# Patient Record
Sex: Male | Born: 1985 | Race: Asian | Hispanic: No | Smoking: Current every day smoker
Health system: Southern US, Community
[De-identification: ages and names within clinical notes are randomized; demographics above are authoritative.]

## PROBLEM LIST (undated history)

## (undated) DIAGNOSIS — F419 Anxiety disorder, unspecified: Secondary | ICD-10-CM

## (undated) DIAGNOSIS — F32A Depression, unspecified: Secondary | ICD-10-CM

## (undated) DIAGNOSIS — J039 Acute tonsillitis, unspecified: Secondary | ICD-10-CM

## (undated) DIAGNOSIS — H9209 Otalgia, unspecified ear: Secondary | ICD-10-CM

## (undated) DIAGNOSIS — R0600 Dyspnea, unspecified: Secondary | ICD-10-CM

## (undated) DIAGNOSIS — K7689 Other specified diseases of liver: Secondary | ICD-10-CM

## (undated) DIAGNOSIS — R39198 Other difficulties with micturition: Secondary | ICD-10-CM

## (undated) DIAGNOSIS — Z8719 Personal history of other diseases of the digestive system: Secondary | ICD-10-CM

## (undated) DIAGNOSIS — K219 Gastro-esophageal reflux disease without esophagitis: Secondary | ICD-10-CM

## (undated) HISTORY — PX: UPPER GI ENDOSCOPY: SHX6162

## (undated) HISTORY — DX: Other specified diseases of liver: K76.89

## (undated) HISTORY — DX: Acute tonsillitis, unspecified: J03.90

## (undated) HISTORY — DX: Other difficulties with micturition: R39.198

## (undated) HISTORY — DX: Personal history of other diseases of the digestive system: Z87.19

## (undated) HISTORY — PX: NO PAST SURGERIES: SHX2092

## (undated) HISTORY — PX: WISDOM TOOTH EXTRACTION: SHX21

---

## 2012-11-13 ENCOUNTER — Encounter: Payer: Self-pay | Admitting: Family Medicine

## 2012-11-13 ENCOUNTER — Ambulatory Visit (INDEPENDENT_AMBULATORY_CARE_PROVIDER_SITE_OTHER): Payer: BC Managed Care – PPO | Admitting: Family Medicine

## 2012-11-13 VITALS — BP 102/64 | HR 70 | Temp 99.0°F | Ht 64.5 in | Wt 136.2 lb

## 2012-11-13 DIAGNOSIS — R109 Unspecified abdominal pain: Secondary | ICD-10-CM

## 2012-11-13 LAB — BASIC METABOLIC PANEL
BUN: 9 mg/dL (ref 6–23)
Chloride: 102 mEq/L (ref 96–112)
GFR: 90.95 mL/min (ref >60.00)
Potassium: 3.8 mEq/L (ref 3.5–5.1)

## 2012-11-13 LAB — HEPATIC FUNCTION PANEL
ALT: 38 U/L (ref 0–53)
AST: 25 U/L (ref 0–37)
Alkaline Phosphatase: 53 U/L (ref 39–117)
Total Bilirubin: 0.8 mg/dL (ref 0.3–1.2)

## 2012-11-13 LAB — CBC WITH DIFFERENTIAL/PLATELET
Basophils Relative: 0.6 % (ref 0.0–3.0)
Eosinophils Relative: 4.4 % (ref 0.0–5.0)
HCT: 46.8 % (ref 39.0–52.0)
Lymphs Abs: 1.6 10*3/uL (ref 0.7–4.0)
MCV: 88.7 fl (ref 78.0–100.0)
Monocytes Absolute: 0.4 10*3/uL (ref 0.1–1.0)
Monocytes Relative: 6.2 % (ref 3.0–12.0)
Neutrophils Relative %: 63.8 % (ref 43.0–77.0)
Platelets: 202 10*3/uL (ref 150.0–400.0)
RBC: 5.28 Mil/uL (ref 4.22–5.81)
WBC: 6.4 10*3/uL (ref 4.5–10.5)

## 2012-11-13 LAB — H. PYLORI ANTIBODY, IGG: H Pylori IgG: NEGATIVE

## 2012-11-13 MED ORDER — GI COCKTAIL ~~LOC~~
30.0000 mL | Freq: Once | ORAL | Status: AC
  Administered 2012-11-13: 30 mL via ORAL

## 2012-11-13 NOTE — Progress Notes (Signed)
    Subjective:     Tyler [name] is a 27 y.o. male who presents for evaluation of abdominal pain. Onset was more than 2 weeks ago. Symptoms have been gradually worsening. The pain is described as pressure-like and bloated.. Pain is located in the epigastric region without radiation.  Aggravating factors: spicy foods.  Alleviating factors: belching, H2 blockers and liquids. Associated symptoms: none. The patient denies anorexia, arthralagias, belching, chills, constipation, diarrhea, dysuria, fever, flatus, frequency, headache, hematochezia, hematuria, melena, myalgias, nausea, sweats and vomiting.  Past Medical History  Diagnosis Date  . No disease found    There are no active problems to display for this patient.  Past Surgical History  Procedure Laterality Date  . No past surgeries     Family History  Problem Relation Age of Onset  . Hypertension Mother    History   Social History  . Marital Status: Single    Spouse Name: N/A    Number of Children: N/A  . Years of Education: N/A   Social History Main Topics  . Smoking status: Current Every Day Smoker    Types: Cigarettes  . Smokeless tobacco: Never Used  . Alcohol Use: Yes  . Drug Use: No  . Sexual Activity: None   Other Topics Concern  . None   Social History Narrative  . None   No current outpatient prescriptions on file.   No current facility-administered medications for this visit.   Allergies: Review of patient's allergies indicates no known allergies.  Review of Systems Pertinent items are noted in HPI.     Objective:    BP 102/64  Pulse 70  Temp(Src) 99 F (37.2 C) (Oral)  Ht 5' 4.5" (1.638 m)  Wt 136 lb 3.2 oz (61.78 kg)  BMI 23.03 kg/m2  SpO2 96% General appearance: alert, cooperative, appears stated age and no distress Throat: lips, mucosa, and tongue normal; teeth and gums normal Neck: no adenopathy, supple, symmetrical, trachea midline and thyroid not enlarged, symmetric, no  tenderness/mass/nodules Lungs: clear to auscultation bilaterally Heart: S1, S2 normal Abdomen: + epigastric tenderness, soft , no guarding or rebound    Assessment:    Abdominal pain, likely secondary to gerd  R/o h pylori .    Plan:    See orders for lab and imaging studies. Adhere to simple, bland diet. Initiate empiric trial of acid suppression; see orders. Further follow-up plans will be based on outcome of lab/imaging studies; see orders. Arrange flexible sigmoidoscopy; see orders. Follow up as needed.

## 2012-11-13 NOTE — Patient Instructions (Signed)

## 2012-11-16 DIAGNOSIS — R109 Unspecified abdominal pain: Secondary | ICD-10-CM

## 2012-11-23 ENCOUNTER — Other Ambulatory Visit (INDEPENDENT_AMBULATORY_CARE_PROVIDER_SITE_OTHER): Payer: BC Managed Care – PPO

## 2012-11-23 ENCOUNTER — Telehealth: Payer: Self-pay | Admitting: Family Medicine

## 2012-11-23 DIAGNOSIS — R109 Unspecified abdominal pain: Secondary | ICD-10-CM

## 2012-11-23 LAB — HEPATIC FUNCTION PANEL
ALT: 53 U/L (ref 0–53)
Albumin: 4.6 g/dL (ref 3.5–5.2)
Alkaline Phosphatase: 55 U/L (ref 39–117)
Bilirubin, Direct: 0.1 mg/dL (ref 0.0–0.3)
Total Protein: 7.6 g/dL (ref 6.0–8.3)

## 2012-11-23 NOTE — Telephone Encounter (Signed)
11/23/2012  Pt came in today for labs.  He wanted to let you know he is now feeling better.  He did have stomach pains 2 days ago and was wondering what he can take when he has the stomach pains.  Please advise.  bw

## 2012-11-23 NOTE — Telephone Encounter (Signed)
FYI for MD.      KP 

## 2012-11-24 NOTE — Telephone Encounter (Signed)
Omeprazole 20 mg #30 1 po qd---  5 refills If pain comes back-- refer to GI

## 2012-11-26 ENCOUNTER — Ambulatory Visit (INDEPENDENT_AMBULATORY_CARE_PROVIDER_SITE_OTHER): Payer: BC Managed Care – PPO | Admitting: Family Medicine

## 2012-11-26 ENCOUNTER — Encounter: Payer: Self-pay | Admitting: Family Medicine

## 2012-11-26 VITALS — BP 98/62 | HR 72 | Temp 98.7°F | Wt 135.4 lb

## 2012-11-26 DIAGNOSIS — R748 Abnormal levels of other serum enzymes: Secondary | ICD-10-CM | POA: Insufficient documentation

## 2012-11-26 MED ORDER — OMEPRAZOLE 20 MG PO CPDR: 20.0000 mg | DELAYED_RELEASE_CAPSULE | Freq: Every day | ORAL | Status: DC

## 2012-11-26 NOTE — Assessment & Plan Note (Signed)
Check [place] abd con't prilosec If con't to inc--- refer to GI

## 2012-11-26 NOTE — Progress Notes (Signed)
  Subjective:    Patient ID: Tyler [name], male    DOB: 12/23/1985, 27 y.o.   MRN: [id number]  HPI Pt here to discuss labs.  abd pain is gone but amylase increased.  No new complaints.    Review of Systems As above    Objective:   Physical Exam  BP 98/62  Pulse 72  Temp(Src) 98.7 F (37.1 C) (Oral)  Wt 135 lb 6.4 oz (61.417 kg)  BMI 22.89 kg/m2  SpO2 97% General appearance: alert, cooperative, appears stated age and no distress Abdomen: soft, non-tender; bowel sounds normal; no masses,  no organomegaly       Assessment & Plan:

## 2012-11-26 NOTE — Patient Instructions (Addendum)
Amylase Amylase is an enzyme. It is measured in the blood. This test can provide information on the function of your pancreas. This test is used to diagnose swelling of the pancreas (pancreatitis) and other pancreatic diseases. The rise of amylase at the beginning of a pancreatitis attack, and its fall after about 2 days, helps to make a diagnosis. This test may be done if you have problems that suggest a disorder of the pancreas. Such problems may include severe abdominal pain, fever, loss of appetite, or nausea. Long-standing pancreatitis may be associated with alcoholism. It can also be caused by injury or a blocked pancreatic duct. It may also be associated with genetic abnormalities such as cystic fibrosis. Amylase levels may be only moderately increased with chronic pancreatitis. Amylase levels may be only moderately decreased when the cells that produce amylase in the pancreas are injured or destroyed. Other causes of an increase in this enzyme include problems with the bowel, ovaries, skeletal muscle, or mumps. Amylase is also used in the diagnosis and follow-up of cancer of the pancreas, ovaries, or lungs, gallbladder attack, and mumps.  PREPARATION FOR TEST No preparation or fasting is necessary. A blood sample will be drawn from a vein in the arm.  NORMAL FINDINGS   Newborn: 6 to 65 units/L  Adults: 60 to 180 units/L (0.8 to 3.2 microkatal/L) Amylase levels remain low for the first 2 months of life. They increase to adult values by 27 year of age. Values may be slightly increased during normal pregnancy and in older adults. Ranges for normal findings may vary among different laboratories and hospitals. You should always check with your caregiver after having lab work or other tests done to discuss the meaning of your test results and whether your values are considered within normal limits. MEANING OF TEST  Your caregiver will go over the test results with you. Your caregiver will discuss the  importance and meaning of your results. He or she will also discuss treatment options and additional tests, if needed. OBTAINING THE TEST RESULTS  It is your responsibility to obtain your test results. Ask the lab or department performing the test when and how you will get your results. Document Released: 03/08/2004 Document Revised: 05/09/2011 Document Reviewed: 01/21/2008 [facility] Patient Information 2014 [place], [place].

## 2012-11-26 NOTE — Telephone Encounter (Signed)
Patient aware and he requested to come in and discuss--Apt scheduled for today.      KP

## 2012-11-27 ENCOUNTER — Other Ambulatory Visit: Payer: Self-pay

## 2012-11-27 DIAGNOSIS — R748 Abnormal levels of other serum enzymes: Secondary | ICD-10-CM

## 2012-11-27 LAB — HEPATIC FUNCTION PANEL
ALT: 41 U/L (ref 0–53)
AST: 26 U/L (ref 0–37)
Albumin: 4.4 g/dL (ref 3.5–5.2)

## 2012-11-27 LAB — AMYLASE: Amylase: 247 U/L — ABNORMAL HIGH (ref 27–131)

## 2012-11-30 ENCOUNTER — Ambulatory Visit
Admission: RE | Admit: 2012-11-30 | Discharge: 2012-11-30 | Disposition: A | Discharge status: Home / Self Care | Payer: BC Managed Care – PPO | Source: Ambulatory Visit | Attending: Family Medicine | Admitting: Family Medicine

## 2012-11-30 DIAGNOSIS — R748 Abnormal levels of other serum enzymes: Secondary | ICD-10-CM

## 2012-12-07 ENCOUNTER — Encounter: Payer: Self-pay | Admitting: Internal Medicine

## 2012-12-11 ENCOUNTER — Encounter: Payer: Self-pay | Admitting: Internal Medicine

## 2012-12-11 ENCOUNTER — Ambulatory Visit (INDEPENDENT_AMBULATORY_CARE_PROVIDER_SITE_OTHER): Payer: BC Managed Care – PPO | Admitting: Internal Medicine

## 2012-12-11 ENCOUNTER — Other Ambulatory Visit (INDEPENDENT_AMBULATORY_CARE_PROVIDER_SITE_OTHER): Payer: BC Managed Care – PPO

## 2012-12-11 VITALS — BP 98/60 | HR 60 | Ht 65.0 in | Wt 134.5 lb

## 2012-12-11 DIAGNOSIS — K7689 Other specified diseases of liver: Secondary | ICD-10-CM

## 2012-12-11 DIAGNOSIS — R748 Abnormal levels of other serum enzymes: Secondary | ICD-10-CM

## 2012-12-11 DIAGNOSIS — R1011 Right upper quadrant pain: Secondary | ICD-10-CM

## 2012-12-11 DIAGNOSIS — K769 Liver disease, unspecified: Secondary | ICD-10-CM

## 2012-12-11 LAB — LIPASE: Lipase: 31 U/L (ref 11.0–59.0)

## 2012-12-11 MED ORDER — OMEPRAZOLE 20 MG PO CPDR: 40.0000 mg | DELAYED_RELEASE_CAPSULE | Freq: Every day | ORAL | Status: DC

## 2012-12-11 NOTE — Assessment & Plan Note (Addendum)
?   Cause NL lipase suggests not from pancreas check isoenzymes, he could have macroamylasemia or a salivary source

## 2012-12-11 NOTE — Assessment & Plan Note (Addendum)
Most of his abdominal pain sounds nondescript, and doesn't lead to an obvious diagnosis. He did have an episode or 2 that sound like biliary colic though he has not had any stones seen on ultrasound. Await the lab testing. I suggested he try a probiotic. He could need other cross-sectional imaging depending upon the results of the repeat lipase and amylase isoenzymes. I have asked him to increase his omeprazole 40 mg every morning to see if that makes a difference is a 20 mg did not seem to help.

## 2012-12-11 NOTE — Patient Instructions (Addendum)
Your physician has requested that you go to the basement for lab work before leaving today.  We will contact you with your results.  We are changing your omeprazole to 2 tablets daily.  Try align probiotic to put the good bacteria back in your colon.  Coupon provided.  It is the time of year to have a vaccination to prevent the flu (influenza virus).  Please have this done through your primary care provider or you can get this done at local pharmacies or the [facility]. It would be very helpful if you notify your primary care provider when and where you had the vaccination given by messaging them in My Chart, leaving a message or faxing the information.   I appreciate the opportunity to care for you.

## 2012-12-11 NOTE — Assessment & Plan Note (Signed)
This is most likely a hemangioma. I've explained this in reassured as best I can. Following up in 6 months with ultrasound is likely to make the most sense. If he needs an MRI for some reason otherwise then we might do that instead but at this point I can't see doing that.

## 2012-12-11 NOTE — Progress Notes (Signed)
Referred by: Lelon Perla, DO  Subjective:    Patient ID: Tyler [name], male    DOB: [birth year], 27 y.o.   MRN: [id number]  HPI The patient is a very pleasant young single [place] man with a several month history of abdominal discomfort. He feels bloated and like he needs to belch but is unable to. He's had partial relief and Gas-X. He saw Dr. Sol Blazing and his amylase was elevated so he had an abdominal ultrasound which was unrevealing though there was a 7 mm maximum hyperechoic lesion in the right lobe of the liver. LFTs within normal. He quit alcohol, he has been on omeprazole 20 mg daily but has not really any better. In general the pain is mild and more of a bother. He mostly has bloating as mentioned. However one time he had severe right upper quadrant pain described as burning and cramping that occurred 5 minutes on at 5 minutes off the last for several hours. He was unable to go to bed and then had more in the morning. He has not had more problems like that. He has not had fever or chills or vomiting or nausea. He is tried a bland diet and reduce smoking as well and still the amylase was elevated. He has not had unintentional weight loss. GI review of systems is otherwise negative.   Review of Systems     Objective:   Physical Exam General:  Well-developed, well-nourished and in no acute distress Eyes:  anicteric. ENT:   Mouth and posterior pharynx free of lesions.  Neck:   supple w/o thyromegaly or mass.  Lungs: Clear to auscultation bilaterally. Heart:  S1S2, no rubs, murmurs, gallops. Abdomen:  soft, non-tender except mild RUQ tenderness, no hepatosplenomegaly, hernia, or mass and BS+.  Lymph:  no cervical or supraclavicular adenopathy. Extremities:   no edema Skin   no rash. Neuro:  A&O x 3.  Psych:  appropriate mood and  Affect.   Data Reviewed:        Assessment & Plan:

## 2012-12-13 LAB — AMYLASE ISOENZYMES
Amylase.: 126 U/L — ABNORMAL HIGH (ref 21–101)
Isoamylase-Pancreatic: 41 U/L (ref 16–46)
Salivary Fraction: 85 U/L — ABNORMAL HIGH (ref 4–61)

## 2012-12-14 NOTE — Progress Notes (Signed)
Quick Note:  Elevated amylase is from salivary gland and likely not a problem  Ask if he has any salivary gland sxs (pain in that area, dry mouth)  Ask if higher dose omeprazole helping  Set up REV 6 weeks also to f/u dyspepsia   ______

## 2013-07-09 ENCOUNTER — Other Ambulatory Visit: Payer: Self-pay | Admitting: Family Medicine

## 2013-07-09 ENCOUNTER — Telehealth: Payer: Self-pay | Admitting: *Deleted

## 2013-07-09 NOTE — Telephone Encounter (Signed)
Patient has been made aware and voiced understanding.     KP 

## 2013-07-09 NOTE — Telephone Encounter (Signed)
She is already in that practice--- she can call and ask for second opinion

## 2013-07-09 NOTE — Telephone Encounter (Signed)
Please advise      KP 

## 2013-07-09 NOTE — Telephone Encounter (Signed)
Caller name:  [place] Relation to pt:  self Call back number: [phone] Pharmacy:  Reason for call:   Pt called requesting referral for Dr. Docia Chuck. Henrene Pastor at [facility].  States he had a referral to see someone else, and they said nothing was wrong.  Pt wants 2nd opinion.  Please advise.  bw

## 2013-07-10 ENCOUNTER — Telehealth: Payer: Self-pay | Admitting: Internal Medicine

## 2013-07-10 ENCOUNTER — Encounter: Payer: Self-pay | Admitting: *Deleted

## 2013-07-10 NOTE — Telephone Encounter (Signed)
Patient will come in and see Alonza Bogus, PA on Friday at 1:30

## 2013-07-10 NOTE — Telephone Encounter (Signed)
Left message for patient to call back  

## 2013-07-12 ENCOUNTER — Encounter: Payer: Self-pay | Admitting: Internal Medicine

## 2013-07-12 ENCOUNTER — Ambulatory Visit (INDEPENDENT_AMBULATORY_CARE_PROVIDER_SITE_OTHER): Payer: BC Managed Care – PPO | Admitting: Gastroenterology

## 2013-07-12 ENCOUNTER — Encounter: Payer: Self-pay | Admitting: Gastroenterology

## 2013-07-12 VITALS — BP 108/64 | HR 70 | Ht 65.0 in | Wt 138.0 lb

## 2013-07-12 DIAGNOSIS — R1013 Epigastric pain: Secondary | ICD-10-CM | POA: Insufficient documentation

## 2013-07-12 DIAGNOSIS — R140 Abdominal distension (gaseous): Secondary | ICD-10-CM | POA: Insufficient documentation

## 2013-07-12 DIAGNOSIS — R142 Eructation: Secondary | ICD-10-CM

## 2013-07-12 DIAGNOSIS — R141 Gas pain: Secondary | ICD-10-CM

## 2013-07-12 DIAGNOSIS — K219 Gastro-esophageal reflux disease without esophagitis: Secondary | ICD-10-CM | POA: Insufficient documentation

## 2013-07-12 DIAGNOSIS — R143 Flatulence: Secondary | ICD-10-CM

## 2013-07-12 NOTE — Progress Notes (Addendum)
07/12/2013 Tyler [name] [id number] October 26, 1985   History of Present Illness:  This is a 28 year old male who was previously seen by Dr. Carlean Purl in October 2014 for complaints of abdominal pain that was nondescript and not leading to any obvious diagnosis. Ultrasound of the abdomen showed a very likely benign lesion in the right hepatic lobe. His pancreatic amylase and lipase levels were normal as well as a CBC, CMP, and he also had a negative H. pylori IgG. He was placed on omeprazole 40 mg daily. Patient reports that the medication seemed to help his symptoms for a while, but eventually he discontinued it. He comes in today with several random complaints.  First, he notes a lot of noises and bubbling in his abdomen. He had one day where he had pain in his belly button that only lasted 1 or 2 minutes and has note recurred. He also complains of feeling full when waking up in the morning despite having regular bowel movements and no bowel issues. He states that he does have pain in his epigastrium as well as some reflux, but once again is not currently on a PPI. He had one occasion where he drank too much alcohol and he had an episode of vomiting the next day that contained some blood; this occurred just on one occasion. He has seen any blood in his stool or black stools.   Current Medications, Allergies, Past Medical History, Past Surgical History, Family History and Social History were reviewed in [employer] record.   Physical Exam: BP 108/64  Pulse 70  Ht 5\' 5"  (1.651 m)  Wt 138 lb (62.596 kg)  BMI 22.96 kg/m2 General: Well developed [place] male in no acute distress Head: Normocephalic and atraumatic Eyes:  Sclerae anicteric, conjunctiva pink  Ears: Normal auditory acuity Lungs: Clear throughout to auscultation Heart: Regular rate and rhythm Abdomen: Soft, non-distended.  Normal bowel sounds.  Mild epigastric TTP without R/R/G. Musculoskeletal: Symmetrical with no  gross deformities  Extremities: No edema  Neurological: Alert oriented x 4, grossly non-focal Psychological:  Alert and cooperative. Normal mood and affect  Assessment and Recommendations: -Epigastric abdominal pain and GERD with one episode of hematemesis after large alcohol consumption:  Will schedule EGD for evaluation to rule out esophagitis, ulcer disease, etc. -Bloating:  Patient likely has a component of irritable bowel syndrome with ongoing complaints of hyperactivity noted in his abdomen. No bowel issues. Will check celiac labs.

## 2013-07-12 NOTE — Progress Notes (Signed)
Agree - note hyperactivity in abdomen not "dad"

## 2013-07-12 NOTE — Patient Instructions (Addendum)
You have a pre visit appointment on 07-29-2013 at 9 am to sign paper work for Upper Endoscopy.   Your physician has requested that you go to the basement for the following lab work before leaving today: Celiac

## 2013-07-29 ENCOUNTER — Ambulatory Visit (AMBULATORY_SURGERY_CENTER): Payer: Self-pay

## 2013-07-29 VITALS — Ht 65.0 in | Wt 137.8 lb

## 2013-07-29 DIAGNOSIS — R1013 Epigastric pain: Secondary | ICD-10-CM

## 2013-07-29 NOTE — Progress Notes (Signed)
No allergies to eggs or soy No past anesthesia No diet/weight loss meds No home oxygen Has email  Emmi instructions given for EGD

## 2013-08-06 ENCOUNTER — Encounter: Payer: Self-pay | Admitting: Internal Medicine

## 2013-08-06 ENCOUNTER — Ambulatory Visit (AMBULATORY_SURGERY_CENTER): Payer: BC Managed Care – PPO | Admitting: Internal Medicine

## 2013-08-06 ENCOUNTER — Other Ambulatory Visit: Payer: Self-pay

## 2013-08-06 VITALS — BP 105/62 | HR 49 | Temp 96.7°F | Resp 15 | Ht 65.0 in | Wt 137.0 lb

## 2013-08-06 DIAGNOSIS — R1013 Epigastric pain: Secondary | ICD-10-CM

## 2013-08-06 DIAGNOSIS — K769 Liver disease, unspecified: Secondary | ICD-10-CM

## 2013-08-06 DIAGNOSIS — D1330 Benign neoplasm of unspecified part of small intestine: Secondary | ICD-10-CM

## 2013-08-06 MED ORDER — SODIUM CHLORIDE 0.9 % IV SOLN: 500.0000 mL | INTRAVENOUS | Status: DC

## 2013-08-06 NOTE — Patient Instructions (Addendum)
The esophagus, stomach and duodenum look normal. I did take biopsies of the intestine to check for celiac sprue.  I will let you know those results and plans.  You need an ultrasound of the liver to follow-up on the small abnormality there. My nurse will arrange that with you.  I appreciate the opportunity to care for you. Gatha Mayer, MD, [facility]   Discharge instructions given with verbal understanding. Biopsies taken. Resume previous medications. YOU HAD AN ENDOSCOPIC PROCEDURE TODAY AT [facility]: Refer to the procedure report that was given to you for any specific questions about what was found during the examination.  If the procedure report does not answer your questions, please call your gastroenterologist to clarify.  If you requested that your care partner not be given the details of your procedure findings, then the procedure report has been included in a sealed envelope for you to review at your convenience later.  YOU SHOULD EXPECT: Some feelings of bloating in the abdomen. Passage of more gas than usual.  Walking can help get rid of the air that was put into your GI tract during the procedure and reduce the bloating. If you had a lower endoscopy (such as a colonoscopy or flexible sigmoidoscopy) you may notice spotting of blood in your stool or on the toilet paper. If you underwent a bowel prep for your procedure, then you may not have a normal bowel movement for a few days.  DIET: Your first meal following the procedure should be a light meal and then it is ok to progress to your normal diet.  A half-sandwich or bowl of soup is an example of a good first meal.  Heavy or fried foods are harder to digest and may make you feel nauseous or bloated.  Likewise meals heavy in dairy and vegetables can cause extra gas to form and this can also increase the bloating.  Drink plenty of fluids but you should avoid alcoholic beverages for 24 hours.  ACTIVITY: Your care partner  should take you home directly after the procedure.  You should plan to take it easy, moving slowly for the rest of the day.  You can resume normal activity the day after the procedure however you should NOT DRIVE or use heavy machinery for 24 hours (because of the sedation medicines used during the test).    SYMPTOMS TO REPORT IMMEDIATELY: A gastroenterologist can be reached at any hour.  During normal business hours, 8:30 AM to 5:00 PM Monday through Friday, call [phone].  After hours and on weekends, please call the GI answering service at [phone] who will take a message and have the physician on call contact you.   Following upper endoscopy (EGD)  Vomiting of blood or coffee ground material  New chest pain or pain under the shoulder blades  Painful or persistently difficult swallowing  New shortness of breath  Fever of 100F or higher  Black, tarry-looking stools  FOLLOW UP: If any biopsies were taken you will be contacted by phone or by letter within the next 1-3 weeks.  Call your gastroenterologist if you have not heard about the biopsies in 3 weeks.  Our staff will call the home number listed on your records the next business day following your procedure to check on you and address any questions or concerns that you may have at that time regarding the information given to you following your procedure. This is a courtesy call and so if there is  no answer at the home number and we have not heard from you through the emergency physician on call, we will assume that you have returned to your regular daily activities without incident.  SIGNATURES/CONFIDENTIALITY: You and/or your care partner have signed paperwork which will be entered into your electronic medical record.  These signatures attest to the fact that that the information above on your After Visit Summary has been reviewed and is understood.  Full responsibility of the confidentiality of this discharge information lies  with you and/or your care-partner.

## 2013-08-06 NOTE — Progress Notes (Signed)
Called to room to assist during endoscopic procedure.  Patient ID and intended procedure confirmed with present staff. Received instructions for my participation in the procedure from the performing physician.  

## 2013-08-06 NOTE — Progress Notes (Signed)
Pt stable to RR 

## 2013-08-06 NOTE — Op Note (Signed)
[name] [place], [address]   ENDOSCOPY PROCEDURE REPORT  PATIENT: Tyler, [name]  MR#: [id number] BIRTHDATE: 11/30/1985 , 27  yrs. old GENDER: Male ENDOSCOPIST: Gatha Mayer, MD, [facility] PROCEDURE DATE:  08/06/2013 PROCEDURE:  EGD w/ biopsy ASA CLASS:     Class I INDICATIONS:  Epigastric pain. MEDICATIONS: Propofol (Diprivan) 140 mg IV, MAC sedation, administered by CRNA, and These medications were titrated to patient response per physician's verbal order TOPICAL ANESTHETIC: none  DESCRIPTION OF PROCEDURE: After the risks benefits and alternatives of the procedure were thoroughly explained, informed consent was obtained.  The LB [id number] endoscope was introduced through the mouth and advanced to the second portion of the duodenum. Without limitations.  The instrument was slowly withdrawn as the mucosa was fully examined.      The upper, middle and distal third of the esophagus were carefully inspected and no abnormalities were noted.  The z-line was well seen at the GEJ.  The endoscope was pushed into the fundus which was normal including a retroflexed view.  The antrum, gastric body, first and second part of the duodenum were unremarkable.  Multiple biopsies were performed from bulb and D2.  Retroflexed views revealed no abnormalities.     The scope was then withdrawn from the patient and the procedure completed.  COMPLICATIONS: There were no complications. ENDOSCOPIC IMPRESSION: Normal EGD; multiple biopsies of duodenum to look for occult celiac sprue  RECOMMENDATIONS: 1.  Await pathology results 2.  Abdominal Ultrasound - Limited RUQ follow-up small hyperechoic liver lesion right lobe on 11/2012 [place] - my office will schedule   eSigned:  Gatha Mayer, MD, [facility] 08/06/2013 3:50 PM   CC:The Patient

## 2013-08-07 ENCOUNTER — Telehealth: Payer: Self-pay

## 2013-08-07 NOTE — Telephone Encounter (Signed)
Left a message at [phone] for the pt to call back if any questions or concerns. maw

## 2013-08-13 NOTE — Progress Notes (Signed)
Quick Note:  He does not have celiac disease Let him know and we will call again with ultrasound results  Graham no letter or recall ______

## 2013-08-14 ENCOUNTER — Telehealth: Payer: Self-pay | Admitting: Family Medicine

## 2013-08-14 NOTE — Telephone Encounter (Signed)
Patient Information:  Caller Name: Reilley  Phone: [phone]  Patient: Tyler [name], Tyler [name]  Gender: Male  DOB: February 04, 1986  Age: 28 Years  PCP: Rosalita Chessman.  Office Follow Up:  Does the office need to follow up with this patient?: No  Instructions For The Office: N/A  RN Note:  Currently at work.  Symptoms  Reason For Call & Symptoms: Has had chest discomfort at times over a few weeks, feeling like bones in chest need to pop like bones in fingers so has stretched shoulders back and when he can hear noise like knuckles cracking, seems to feel better.   Woke 3:30 am today with pain in upper chest below clavicle and down into center chest for 2-3 bone levels.  Pain was so severe he could not move.   Pain has been constant since then, feels like heavy pressure, currently 5/10 level.  Pain worse if lying down, lessens if sitting.  Reviewed Health History In EMR: Yes  Reviewed Medications In EMR: Yes  Reviewed Allergies In EMR: Yes  Reviewed Surgeries / Procedures: Yes  Date of Onset of Symptoms: 08/14/2013  Treatments Tried: Has been spraying on numbing solution onto chest  Treatments Tried Worked: No  Guideline(s) Used:  Chest Pain  Disposition Per Guideline:   Call EMS 911 Now  Reason For Disposition Reached:   Chest pain lasting longer than 5 minutes and ANY of the following:  Over 13 years old Over 45 years old and at least one cardiac risk factor (i.e., high blood pressure, diabetes, high cholesterol, obesity, smoker or strong family history of heart disease) Pain is crushing, pressure-like, or heavy  Took nitroglycerin and chest pain was not relieved History of heart disease (i.e., angina, heart attack, bypass surgery, angioplasty, CHF)  Advice Given:  N/A  Patient Will Follow Care Advice:  YES

## 2013-08-15 ENCOUNTER — Ambulatory Visit (HOSPITAL_COMMUNITY)
Admission: RE | Admit: 2013-08-15 | Discharge: 2013-08-15 | Disposition: A | Discharge status: Home / Self Care | Payer: BC Managed Care – PPO | Source: Ambulatory Visit | Attending: Internal Medicine | Admitting: Internal Medicine

## 2013-08-15 ENCOUNTER — Encounter: Payer: Self-pay | Admitting: Internal Medicine

## 2013-08-15 DIAGNOSIS — K769 Liver disease, unspecified: Secondary | ICD-10-CM

## 2013-08-15 DIAGNOSIS — K7689 Other specified diseases of liver: Secondary | ICD-10-CM | POA: Insufficient documentation

## 2013-08-15 NOTE — Progress Notes (Signed)
Quick Note:  Let him know that US shows stability of the small lesion so not a cancer and most likely a common thing called a hemangioma which is a collection of blood vessels - not a problem for hi,m  I advise align or other probiotic daily to help his bloating and borborygmi (bubbling sounds) and see me prn  Am ccing Jess for F/U ______

## 2014-09-19 ENCOUNTER — Ambulatory Visit (HOSPITAL_BASED_OUTPATIENT_CLINIC_OR_DEPARTMENT_OTHER)
Admission: RE | Admit: 2014-09-19 | Discharge: 2014-09-19 | Disposition: A | Discharge status: Home / Self Care | Payer: BLUE CROSS/BLUE SHIELD | Source: Ambulatory Visit | Attending: Family Medicine | Admitting: Family Medicine

## 2014-09-19 ENCOUNTER — Encounter: Payer: Self-pay | Admitting: Family Medicine

## 2014-09-19 ENCOUNTER — Ambulatory Visit (INDEPENDENT_AMBULATORY_CARE_PROVIDER_SITE_OTHER): Payer: BLUE CROSS/BLUE SHIELD | Admitting: Family Medicine

## 2014-09-19 VITALS — BP 100/60 | HR 78 | Temp 99.1°F | Wt 140.8 lb

## 2014-09-19 DIAGNOSIS — R05 Cough: Secondary | ICD-10-CM | POA: Diagnosis not present

## 2014-09-19 DIAGNOSIS — J209 Acute bronchitis, unspecified: Secondary | ICD-10-CM | POA: Diagnosis not present

## 2014-09-19 DIAGNOSIS — R072 Precordial pain: Secondary | ICD-10-CM | POA: Insufficient documentation

## 2014-09-19 DIAGNOSIS — R059 Cough, unspecified: Secondary | ICD-10-CM

## 2014-09-19 LAB — CBC WITH DIFFERENTIAL/PLATELET
BASOS PCT: 1 % (ref 0–1)
Basophils Absolute: 0.1 10*3/uL (ref 0.0–0.1)
Eosinophils Absolute: 0.2 10*3/uL (ref 0.0–0.7)
Eosinophils Relative: 2 % (ref 0–5)
HCT: 43.7 % (ref 39.0–52.0)
HEMOGLOBIN: 15.4 g/dL (ref 13.0–17.0)
Lymphocytes Relative: 29 % (ref 12–46)
Lymphs Abs: 2.3 10*3/uL (ref 0.7–4.0)
MCH: 30.5 pg (ref 26.0–34.0)
MCHC: 35.2 g/dL (ref 30.0–36.0)
MCV: 86.5 fL (ref 78.0–100.0)
MPV: 9.4 fL (ref 8.6–12.4)
Monocytes Absolute: 0.6 10*3/uL (ref 0.1–1.0)
Monocytes Relative: 8 % (ref 3–12)
Neutro Abs: 4.9 10*3/uL (ref 1.7–7.7)
Neutrophils Relative %: 60 % (ref 43–77)
Platelets: 222 10*3/uL (ref 150–400)
RBC: 5.05 MIL/uL (ref 4.22–5.81)
RDW: 12.7 % (ref 11.5–15.5)
WBC: 8.1 10*3/uL (ref 4.0–10.5)

## 2014-09-19 MED ORDER — AMOXICILLIN-POT CLAVULANATE 875-125 MG PO TABS
1.0000 | ORAL_TABLET | Freq: Two times a day (BID) | ORAL | Status: DC
Start: 2014-09-19 — End: 2015-02-11

## 2014-09-19 NOTE — Progress Notes (Signed)
Patient ID: Tyler [name], male    DOB: 1985/10/23  Age: 29 y.o. MRN: [id number]    Subjective:  Subjective HPI Tyler [name] presents with c/o uri symptoms about 2 weeks ago.  It has progressed to productive cough and wheezing.  Pt thinks he had a fever as well.  He tried dayquil and nyquil --- he feels like his chest is tight and wants to pop.  He is now just taking tylenol.    Review of Systems  Constitutional: Positive for fever. Negative for chills.  HENT: Negative for congestion, postnasal drip, rhinorrhea and sinus pressure.   Respiratory: Positive for cough, chest tightness and wheezing. Negative for shortness of breath.   Cardiovascular: Negative for chest pain, palpitations and leg swelling.  Allergic/Immunologic: Negative for environmental allergies.    History Past Medical History  Diagnosis Date  . Other specified disorders of liver   . Tonsillitis     He has past surgical history that includes No past surgeries.   His family history includes Hypertension in his mother; Kidney disease in his paternal uncle.He reports that he has been smoking Cigarettes.  He has a 5 pack-year smoking history. He has never used smokeless tobacco. He reports that he does not drink alcohol or use illicit drugs.  Current Outpatient Prescriptions on File Prior to Visit  Medication Sig Dispense Refill  . acetaminophen (TYLENOL) 500 MG tablet Take 1,000 mg by mouth every 6 (six) hours as needed.    Marland Kitchen ibuprofen (ADVIL,MOTRIN) 200 MG tablet Take 200 mg by mouth every 6 (six) hours as needed.    . sodium-potassium bicarbonate (ALKA-SELTZER GOLD) TBEF dissolvable tablet Take 1 tablet by mouth daily as needed.     No current facility-administered medications on file prior to visit.     Objective:  Objective Physical Exam  Constitutional: He is oriented to person, place, and time. He appears well-developed and well-nourished.  HENT:  Right Ear: External ear normal.  Left Ear: External ear  normal.  + PND + errythema  Eyes: Conjunctivae are normal. Right eye exhibits no discharge. Left eye exhibits no discharge.  Cardiovascular: Normal rate, regular rhythm and normal heart sounds.   No murmur heard. Pulmonary/Chest: Effort normal and breath sounds normal. No respiratory distress. He has no wheezes. He has no rales. He exhibits no tenderness.  Musculoskeletal: He exhibits no edema.  Lymphadenopathy:    He has no cervical adenopathy.  Neurological: He is alert and oriented to person, place, and time.  Psychiatric: His mood appears anxious. He does not exhibit a depressed mood. He expresses no homicidal ideation.   BP 100/60 mmHg  Pulse 78  Temp(Src) 99.1 F (37.3 C) (Oral)  Wt 140 lb 12.8 oz (63.866 kg)  SpO2 98% Wt Readings from Last 3 Encounters:  09/19/14 140 lb 12.8 oz (63.866 kg)  08/06/13 137 lb (62.143 kg)  07/29/13 137 lb 12.8 oz (62.506 kg)     Lab Results  Component Value Date   WBC 6.4 11/13/2012   HGB 15.9 11/13/2012   HCT 46.8 11/13/2012   PLT 202.0 11/13/2012   GLUCOSE 115* 11/13/2012   ALT 41 11/26/2012   AST 26 11/26/2012   NA 137 11/13/2012   K 3.8 11/13/2012   CL 102 11/13/2012   CREATININE 1.0 11/13/2012   BUN 9 11/13/2012   CO2 29 11/13/2012    US Abdomen Limited Ruq  08/15/2013   CLINICAL DATA:  Follow up hyperechoic liver lesion right lobe on [place] from 11/2012  EXAM:  US ABDOMEN LIMITED - RIGHT UPPER QUADRANT  COMPARISON:  None.  FINDINGS: Gallbladder:  No gallstones or wall thickening visualized, measuring 2 mm. No sonographic Murphy sign noted.  Common bile duct:  Diameter: 2 mm.  Liver:  Stable small hyperechoic nodule right lobe of the liver. The sonographic appearance and stability favor a benign small hemangioma. Liver otherwise normal limits in parenchymal echogenicity.  IMPRESSION: Stable small echogenic nodule right lobe of the liver. Stability and sonographic characteristics favor a small benign hemangioma.  No further sonographic  abnormalities.   Electronically Signed   By: Margaree Mackintosh M.D.   On: 08/15/2013 10:38     Assessment & Plan:  Plan I am having Mr. Sebo start on amoxicillin-clavulanate. I am also having him maintain his acetaminophen, sodium-potassium bicarbonate, and ibuprofen.  Meds ordered this encounter  Medications  . amoxicillin-clavulanate (AUGMENTIN) 875-125 MG per tablet    Sig: Take 1 tablet by mouth 2 (two) times daily.    Dispense:  20 tablet    Refill:  0    Problem List Items Addressed This Visit    None    Visit Diagnoses    Cough    -  Primary    Relevant Orders    DG Chest 2 View (Completed)    CBC with Differential/Platelet    Acute bronchitis, unspecified organism        Relevant Medications    amoxicillin-clavulanate (AUGMENTIN) 875-125 MG per tablet    Other Relevant Orders    CBC with Differential/Platelet       Follow-up: Return if symptoms worsen or fail to improve.  Garnet Koyanagi, DO

## 2014-09-19 NOTE — Progress Notes (Signed)
Pre visit review using our clinic review tool, if applicable. No additional management support is needed unless otherwise documented below in the visit note. 

## 2014-09-19 NOTE — Patient Instructions (Signed)
Cough, Adult  A cough is a reflex that helps clear your throat and airways. It can help heal the body or may be a reaction to an irritated airway. A cough may only last 2 or 3 weeks (acute) or may last more than 8 weeks (chronic).  CAUSES Acute cough:  Viral or bacterial infections. Chronic cough:  Infections.  Allergies.  Asthma.  Post-nasal drip.  Smoking.  Heartburn or acid reflux.  Some medicines.  Chronic lung problems (COPD).  Cancer. SYMPTOMS   Cough.  Fever.  Chest pain.  Increased breathing rate.  High-pitched whistling sound when breathing (wheezing).  Colored mucus that you cough up (sputum). TREATMENT   A bacterial cough may be treated with antibiotic medicine.  A viral cough must run its course and will not respond to antibiotics.  Your caregiver may recommend other treatments if you have a chronic cough. HOME CARE INSTRUCTIONS   Only take over-the-counter or prescription medicines for pain, discomfort, or fever as directed by your caregiver. Use cough suppressants only as directed by your caregiver.  Use a cold steam vaporizer or humidifier in your bedroom or home to help loosen secretions.  Sleep in a semi-upright position if your cough is worse at night.  Rest as needed.  Stop smoking if you smoke. SEEK IMMEDIATE MEDICAL CARE IF:   You have pus in your sputum.  Your cough starts to worsen.  You cannot control your cough with suppressants and are losing sleep.  You begin coughing up blood.  You have difficulty breathing.  You develop pain which is getting worse or is uncontrolled with medicine.  You have a fever. MAKE SURE YOU:   Understand these instructions.  Will watch your condition.  Will get help right away if you are not doing well or get worse. Document Released: 08/13/2010 Document Revised: 05/09/2011 Document Reviewed: 08/13/2010 [employer] Patient Information 2015 [employer]. This information is not intended  to replace advice given to you by your health care provider. Make sure you discuss any questions you have with your health care provider.  

## 2014-09-20 ENCOUNTER — Encounter: Payer: Self-pay | Admitting: Family Medicine

## 2014-09-22 ENCOUNTER — Telehealth: Payer: Self-pay | Admitting: Family Medicine

## 2014-09-22 NOTE — Telephone Encounter (Signed)
Pt returned call. Notified pt of normal labs and negative chest xray. Pt will call back if still having trouble after finishing abx.

## 2014-09-23 ENCOUNTER — Telehealth: Payer: Self-pay | Admitting: Family Medicine

## 2014-09-23 NOTE — Telephone Encounter (Signed)
Relation to pt: self Call back number: [phone]   Reason for call:  Patient states amoxicillin-clavulanate (AUGMENTIN) 875-125 MG per tablet is causing patient to be constipated. Please advise

## 2014-09-23 NOTE — Telephone Encounter (Signed)
Spoke with patient regarding the below note. He reported trouble with having a bowel movement for the past three days, but no rectal pain or bleeding. However, patient does verbalizes some occasional abdominal pain, but addresses it as very minor. Please advise.

## 2014-09-23 NOTE — Telephone Encounter (Signed)
Spoke with patient and he verbalized understanding and has agreed to try the Miralax and increase his fiber. He will follow up if no improvement.     KP

## 2014-09-23 NOTE — Telephone Encounter (Signed)
augmentin normally causes diarrhea not constipation Take miralax daily,  Increase fiber in diet and f/u if symptoms do not improve

## 2014-09-30 ENCOUNTER — Other Ambulatory Visit: Payer: Self-pay | Admitting: Family Medicine

## 2014-09-30 DIAGNOSIS — R071 Chest pain on breathing: Secondary | ICD-10-CM

## 2014-09-30 NOTE — Telephone Encounter (Signed)
Patient was seen on 09/19/14 and was complaining of pain near sternum, at that time he had a cough- CXR negative, given Augmentin.  Patient states that he has completed the Augmentin and the cough has resolved but the pain is the same.  No shortness of breath.  He states it is worst when he first wakes up in the morning and the only thing that relieves is if he is able to "pop" it by stretching, which does not always work.  He scheduled an office follow-up visit 10/02/14- is there anything he should do in the meantime?   Please advise.

## 2014-09-30 NOTE — Telephone Encounter (Signed)
Please Triage symptoms.    KP

## 2014-09-30 NOTE — Telephone Encounter (Signed)
Ct chest ordered 

## 2014-09-30 NOTE — Telephone Encounter (Signed)
Caller name: Zeki Relationship to patient: self Can be reached: [phone]  Reason for call: Pt is having pain in rib cage still (related to below). He states he was advised to call when RX was completed. Pt asked for return call.

## 2014-10-02 ENCOUNTER — Ambulatory Visit (INDEPENDENT_AMBULATORY_CARE_PROVIDER_SITE_OTHER): Payer: BLUE CROSS/BLUE SHIELD | Admitting: Family Medicine

## 2014-10-02 ENCOUNTER — Encounter: Payer: Self-pay | Admitting: Family Medicine

## 2014-10-02 ENCOUNTER — Ambulatory Visit (HOSPITAL_BASED_OUTPATIENT_CLINIC_OR_DEPARTMENT_OTHER)
Admission: RE | Admit: 2014-10-02 | Discharge: 2014-10-02 | Disposition: A | Discharge status: Home / Self Care | Payer: BLUE CROSS/BLUE SHIELD | Source: Ambulatory Visit | Attending: Family Medicine | Admitting: Family Medicine

## 2014-10-02 VITALS — BP 96/68 | HR 72 | Temp 98.3°F | Resp 18 | Ht 65.0 in | Wt 140.0 lb

## 2014-10-02 DIAGNOSIS — R0789 Other chest pain: Secondary | ICD-10-CM

## 2014-10-02 DIAGNOSIS — R079 Chest pain, unspecified: Secondary | ICD-10-CM

## 2014-10-02 NOTE — Progress Notes (Signed)
Pre visit review using our clinic review tool, if applicable. No additional management support is needed unless otherwise documented below in the visit note. 

## 2014-10-02 NOTE — Progress Notes (Signed)
Patient ID: Tyler [name], male    DOB: 02/21/1986  Age: 29 y.o. MRN: [id number]    Subjective:  Subjective HPI Tyler [name] presents for f/u pain in sternum--- popping with stretching.  No new inury  Review of Systems  Constitutional: Negative for diaphoresis, appetite change, fatigue and unexpected weight change.  Eyes: Negative for pain, redness and visual disturbance.  Respiratory: Negative for cough, chest tightness, shortness of breath and wheezing.   Cardiovascular: Negative for chest pain, palpitations and leg swelling.  Endocrine: Negative for cold intolerance, heat intolerance, polydipsia, polyphagia and polyuria.  Genitourinary: Negative for dysuria, frequency and difficulty urinating.  Neurological: Negative for dizziness, light-headedness, numbness and headaches.    History Past Medical History  Diagnosis Date  . Other specified disorders of liver   . Tonsillitis     He has past surgical history that includes No past surgeries.   His family history includes Hypertension in his mother; Kidney disease in his paternal uncle.He reports that he has been smoking Cigarettes.  He has a 5 pack-year smoking history. He has never used smokeless tobacco. He reports that he does not drink alcohol or use illicit drugs.  Current Outpatient Prescriptions on File Prior to Visit  Medication Sig Dispense Refill  . acetaminophen (TYLENOL) 500 MG tablet Take 1,000 mg by mouth every 6 (six) hours as needed.    Marland Kitchen amoxicillin-clavulanate (AUGMENTIN) 875-125 MG per tablet Take 1 tablet by mouth 2 (two) times daily. (Patient not taking: Reported on 10/02/2014) 20 tablet 0  . ibuprofen (ADVIL,MOTRIN) 200 MG tablet Take 200 mg by mouth every 6 (six) hours as needed.    . sodium-potassium bicarbonate (ALKA-SELTZER GOLD) TBEF dissolvable tablet Take 1 tablet by mouth daily as needed.     No current facility-administered medications on file prior to visit.     Objective:  Objective Physical  Exam  Constitutional: He is oriented to person, place, and time. Vital signs are normal. He appears well-developed and well-nourished. He is sleeping.  HENT:  Head: Normocephalic and atraumatic.  Mouth/Throat: Oropharynx is clear and moist.  Eyes: EOM are normal. Pupils are equal, round, and reactive to light.  Neck: Normal range of motion. Neck supple. No thyromegaly present.  Cardiovascular: Normal rate and regular rhythm.   No murmur heard. Pulmonary/Chest: Effort normal and breath sounds normal. No respiratory distress. He has no wheezes. He has no rales. He exhibits no tenderness.  Musculoskeletal: He exhibits no edema or tenderness.  Neurological: He is alert and oriented to person, place, and time.  Skin: Skin is warm and dry.  Psychiatric: He has a normal mood and affect. His behavior is normal. Judgment and thought content normal.   BP 96/68 mmHg  Pulse 72  Temp(Src) 98.3 F (36.8 C) (Oral)  Resp 18  Ht 5\' 5"  (1.651 m)  Wt 140 lb (63.504 kg)  BMI 23.30 kg/m2  SpO2 99% Wt Readings from Last 3 Encounters:  10/02/14 140 lb (63.504 kg)  09/19/14 140 lb 12.8 oz (63.866 kg)  08/06/13 137 lb (62.143 kg)     Lab Results  Component Value Date   WBC 8.1 09/19/2014   HGB 15.4 09/19/2014   HCT 43.7 09/19/2014   PLT 222 09/19/2014   GLUCOSE 115* 11/13/2012   ALT 41 11/26/2012   AST 26 11/26/2012   NA 137 11/13/2012   K 3.8 11/13/2012   CL 102 11/13/2012   CREATININE 1.0 11/13/2012   BUN 9 11/13/2012   CO2 29 11/13/2012  No results found.   Assessment & Plan:  Plan I am having Tyler [name] maintain his acetaminophen, sodium-potassium bicarbonate, ibuprofen, and amoxicillin-clavulanate.  No orders of the defined types were placed in this encounter.    Problem List Items Addressed This Visit    None    Visit Diagnoses    Sternal pain    -  Primary    Relevant Orders    DG Sternum (Completed)     ct pending Pt extemely anxious He goes to chiropractor for  his back... He feels better when he stretches and "pops" chest.    Pt will see chiropractor and see if ribs need adjusted  Follow-up: Return in about 6 months (around 04/04/2015), or if symptoms worsen or fail to improve, for annual exam, fasting.  Garnet Koyanagi, DO

## 2014-10-02 NOTE — Patient Instructions (Addendum)
Dentist--- Dr Christie Nottingham Take zyrtec , claritin , or allegra---and get nasacort or rhinocort over the counter     Sternoclavicular Separation A sternoclavicular separation is an uncommon injury. It occurs when the ligaments between the breastbone (sternum) and collarbone (clavicle) are stretched or torn (sprained). A sprain of these ligaments can vary in severity. Sprains are classified into 3 categories. Grade 1 sprains cause pain, but the tendon is not lengthened. Grade 2 sprains include a lengthened ligament, due to the ligament being stretched or partially ruptured. With grade 2 sprains, there is still function, although function may be decreased. Grade 3 sprains are marked by a complete tear of the ligament. The joint has a loss of function. SYMPTOMS   Severe pain, tenderness, swelling, bruising, and sometimes a bony bump at the sternoclavicular joint.  Pain at the sternoclavicular joint when trying to bring the affected arm across and in front of the body.  Respiratory symptoms. (This is rare, but if any occur, it is an emergency situation.)  Hoarse voice.  Difficulty swallowing.  Neck fullness.  Choking sensation. CAUSES   This condition is caused by injury (trauma) to the ligament that connects the sternoclavicular joint. If the injury places a greater force on the ligament than it can withstand, a sprain occurs.  Common injuries causing a sternoclavicular separation are:  Collarbone injury.  Violent force from the side.  Compression of the shoulder toward the sternum.  Falling on an outstretched hand (less common). RISK INCREASES WITH:  Contact sports (football, soccer, boxing) and weightlifting.  Previous collarbone injury or sternal injury.  Poor strength and flexibility.  Inadequate or poorly fitted protective equipment. PREVENTION  Warm up and stretch properly before activity.  Maintain proper conditioning:  Shoulder and arm flexibility.  Muscle  strength and endurance.  Wear properly fitted and padded protective equipment (chest and shoulder pads).  Learn and use proper technique (including falling and landing). Have a coach correct improper technique.  Taping, protective strapping, or an adhesive bandage may be advised before practice or competition. PROGNOSIS  If treated properly, sternoclavicular separations can typically be cured. Healing time varies, depending on the severity of the injury and the type of sport played. Occasionally, surgery is needed to repair the torn ligaments. RELATED COMPLICATIONS   Weakness and fatigue of the arm and shoulder (uncommon).  Continued pain and inflammation of the sternoclavicular joint.  Longer healing time and being vulnerable to recurrent injury, if usual activities are resumed too soon.  Prolonged pain or disability (occasionally).  Unstable or arthritic shoulder, following repeated injury.  Death from posterior displacement of collarbone into airway, arteries, veins, or nerves of the neck. TREATMENT Treatment first involves ice and medicine to reduce pain and inflammation. The sternoclavicular joint is typically immobilized with an arm sling for a period of time. This allows for healing. Strengthening and stretching exercises may be needed to prevent shoulder stiffness after immobilization. These exercises may be performed at home or with a therapist. Return to sport depends upon:  The severity of injury.  The type of sport played. Surgery may be needed, especially if the sprain is posteriorly displaced (collarbone goes backward into the neck). This may cause compression of the vital structures in the neck, including the:  Airway.  Voice box.  Blood vessels to the arms or head. Posteriorly displaced sprains are a medical emergency and must be treated immediately. Rarely, surgery is needed for those with chronic pain who have not recovered after 4 to 6 months of  non-surgical  treatment.  MEDICATION   If pain medicine is needed, nonsteroidal anti-inflammatory medicines, such as aspirin and ibuprofen, or other minor pain relievers, such as acetaminophen, are often recommended.  Do not take pain medicine for 7 days before surgery.  Prescription pain relievers may be prescribed. Use only as directed and only as much as you need.  Ointments applied to the skin may be helpful.  Injections of corticosteroids may be given to reduce inflammation. However, this medicine is not usually used for acute injuries. HEAT AND COLD  Cold treatment (icing) relieves pain and reduces inflammation. Cold treatment should be applied for 10 to 15 minutes every 2 to 3 hours for inflammation and pain and immediately after any activity that aggravates your symptoms. Use ice packs or massage the area with a piece of ice (ice massage).  Heat treatment may be used before performing the stretching and strengthening activities prescribed by your caregiver, physical therapist, or athletic trainer. Use a heat pack or soak your injury in warm water. SEEK MEDICAL CARE IF:   Pain, swelling, or bruising gets worse, despite treatment.  You experience pain, numbness, swelling, or coldness in the arm.  Blue, gray, or dark color appears in the fingernails.  You develop a hoarse voice or difficulty swallowing.  The collarbone moves back out of normal position (if it was repositioned).  Any of the following occur after surgery:  Increased pain, swelling, redness, drainage of fluids, or bleeding from the surgical area.  Signs of infection (headache, muscle aches, dizziness, or general ill feeling with fever).  New, unexplained symptoms develop. (Drugs used in treatment may produce side effects.) Document Released: 02/14/2005 Document Revised: 05/09/2011 Document Reviewed: 05/29/2008 [facility] Patient Information 2015 [place], [place]. This information is not intended to replace advice given to you  by your health care provider. Make sure you discuss any questions you have with your health care provider.

## 2015-02-11 ENCOUNTER — Ambulatory Visit (INDEPENDENT_AMBULATORY_CARE_PROVIDER_SITE_OTHER): Payer: BLUE CROSS/BLUE SHIELD | Admitting: Internal Medicine

## 2015-02-11 ENCOUNTER — Encounter: Payer: Self-pay | Admitting: Internal Medicine

## 2015-02-11 VITALS — BP 118/76 | HR 88 | Temp 98.2°F | Ht 65.0 in | Wt 142.1 lb

## 2015-02-11 DIAGNOSIS — R399 Unspecified symptoms and signs involving the genitourinary system: Secondary | ICD-10-CM | POA: Diagnosis not present

## 2015-02-11 DIAGNOSIS — R339 Retention of urine, unspecified: Secondary | ICD-10-CM | POA: Diagnosis not present

## 2015-02-11 DIAGNOSIS — Z114 Encounter for screening for human immunodeficiency virus [HIV]: Secondary | ICD-10-CM

## 2015-02-11 DIAGNOSIS — Z8719 Personal history of other diseases of the digestive system: Secondary | ICD-10-CM

## 2015-02-11 LAB — POCT URINALYSIS DIPSTICK
Bilirubin, UA: NEGATIVE
Ketones, UA: NEGATIVE
Leukocytes, UA: NEGATIVE
NITRITE UA: NEGATIVE
PH UA: 6
Protein, UA: NEGATIVE
SPEC GRAV UA: 1.02
UROBILINOGEN UA: 0.2

## 2015-02-11 LAB — URINALYSIS, ROUTINE W REFLEX MICROSCOPIC
BILIRUBIN URINE: NEGATIVE
Ketones, ur: NEGATIVE
Leukocytes, UA: NEGATIVE
Nitrite: NEGATIVE
SPECIFIC GRAVITY, URINE: 1.01 (ref 1.000–1.030)
TOTAL PROTEIN, URINE-UPE24: NEGATIVE
UROBILINOGEN UA: 0.2 (ref 0.0–1.0)
Urine Glucose: 250 — AB
pH: 6.5 (ref 5.0–8.0)

## 2015-02-11 MED ORDER — SULFAMETHOXAZOLE-TRIMETHOPRIM 800-160 MG PO TABS: 1.0000 | ORAL_TABLET | Freq: Two times a day (BID) | ORAL | Status: DC

## 2015-02-11 NOTE — Progress Notes (Signed)
Subjective:    Patient ID: Tyler [name], male    DOB: 1985/08/25, 29 y.o.   MRN: [id number]  DOS:  02/11/2015 Type of visit - description : Acute visit Interval history:  Symptoms started yesterday with difficulty emptying his bladder, less forceful urination, some frequency. 3 weeks ago, was sexually active, used a condom. He is not doing anything different except that he drank 2 beers a couple of days ago, something unusual for him.   Review of Systems  Denies fever chills No nausea, vomiting. No abdominal or flank pain No dysuria or gross hematuria. No genital rash or ulcers.  Past Medical History  Diagnosis Date  . Other specified disorders of liver   . Tonsillitis     Past Surgical History  Procedure Laterality Date  . No past surgeries      Social History   Social History  . Marital Status: Single    Spouse Name: N/A  . Number of Children: 0  . Years of Education: N/A   Occupational History  . Health and safety inspector    Social History Main Topics  . Smoking status: Current Every Day Smoker -- 0.50 packs/day for 10 years    Types: Cigarettes  . Smokeless tobacco: Never Used  . Alcohol Use: No     Comment: rarely   . Drug Use: No  . Sexual Activity:    Partners: Female   Other Topics Concern  . Not on file   Social History Narrative   Single, no children   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[employer]   uses alcohol no drug use   Updated 08/06/2013                 Medication List       This list is accurate as of: 02/11/15 11:59 PM.  Always use your most recent med list.               acetaminophen 500 MG tablet  Commonly known as:  TYLENOL  Take 1,000 mg by mouth every 6 (six) hours as needed. Reported on 02/11/2015     sulfamethoxazole-trimethoprim 800-160 MG tablet  Commonly known as:  BACTRIM DS,SEPTRA DS  Take 1 tablet by mouth 2 (two) times daily.           Objective:   Physical Exam BP 118/76 mmHg  Pulse 88  Temp(Src) 98.2 F (36.8  C) (Oral)  Ht 5\' 5"  (1.651 m)  Wt 142 lb 2 oz (64.467 kg)  BMI 23.65 kg/m2  SpO2 98% General:   Well developed, well nourished . NAD.  HEENT:  Normocephalic . Face symmetric, atraumatic Abdomen:  Not distended, soft,slightly TTP in the suprapubic area without mass or rebound. DRE: Normal external examination, prostate is not enlarged, nontender. Skin: Not pale. Not jaundice Neurologic:  alert & oriented X3.  Speech normal, gait appropriate for age and unassisted Psych--  Cognition and judgment appear intact.  Cooperative with normal attention span and concentration.  Behavior appropriate. No anxious or depressed appearing.    Assessment & Plan:   LUTS: Urinary sx for the last 24 hours, had sexual activity, protected, 3 weeks ago, no obvious evidence of STD. Udip trace blood sugar DDX: UTI versus mild prostatitis. Plan: BMP (d/t  glucosuria), PSA, HIV, RPR. UA, urine culture, G&C. Empiric Bactrim for 3 weeks for presumed prostatitis. Addendum: Few weeks ago, he also reports he saw some blood in the stools, mild anal discomfort. Will check a CBC and reassess on RTC  RTC 4 weeks

## 2015-02-11 NOTE — Patient Instructions (Addendum)
Get your blood work before you leave   Take the antibiotic x 3 weeks    Next visit  for a check up in 4 weeks    Please schedule an appointment at the front desk

## 2015-02-11 NOTE — Progress Notes (Signed)
Pre visit review using our clinic review tool, if applicable. No additional management support is needed unless otherwise documented below in the visit note. 

## 2015-02-12 ENCOUNTER — Telehealth: Payer: Self-pay | Admitting: Family Medicine

## 2015-02-12 LAB — CBC WITH DIFFERENTIAL/PLATELET
BASOS ABS: 0.1 10*3/uL (ref 0.0–0.1)
BASOS PCT: 1.4 % (ref 0.0–3.0)
Eosinophils Absolute: 0.2 10*3/uL (ref 0.0–0.7)
Eosinophils Relative: 2.6 % (ref 0.0–5.0)
HEMATOCRIT: 47 % (ref 39.0–52.0)
HEMOGLOBIN: 15.9 g/dL (ref 13.0–17.0)
LYMPHS PCT: 20.1 % (ref 12.0–46.0)
Lymphs Abs: 1.8 10*3/uL (ref 0.7–4.0)
MCHC: 33.8 g/dL (ref 30.0–36.0)
MCV: 90.4 fl (ref 78.0–100.0)
Monocytes Absolute: 0.5 10*3/uL (ref 0.1–1.0)
Monocytes Relative: 5.3 % (ref 3.0–12.0)
NEUTROS ABS: 6.2 10*3/uL (ref 1.4–7.7)
Neutrophils Relative %: 70.6 % (ref 43.0–77.0)
Platelets: 226 10*3/uL (ref 150.0–400.0)
RBC: 5.21 Mil/uL (ref 4.22–5.81)
RDW: 12.2 % (ref 11.5–15.5)
WBC: 8.8 10*3/uL (ref 4.0–10.5)
nRBC: 0 /100 WBC (ref 0–4)

## 2015-02-12 LAB — BASIC METABOLIC PANEL
BUN: 11 mg/dL (ref 6–23)
CHLORIDE: 101 meq/L (ref 96–112)
CO2: 33 mEq/L — ABNORMAL HIGH (ref 19–32)
Calcium: 9.6 mg/dL (ref 8.4–10.5)
Creatinine, Ser: 1.12 mg/dL (ref 0.40–1.50)
GFR: 82.16 mL/min (ref >60.00)
Glucose, Bld: 108 mg/dL — ABNORMAL HIGH (ref 70–99)
Potassium: 4 mEq/L (ref 3.5–5.1)
Sodium: 139 mEq/L (ref 135–145)

## 2015-02-12 LAB — RPR

## 2015-02-12 LAB — GC/CHLAMYDIA PROBE AMP, URINE
Chlamydia, Swab/Urine, PCR: NOT DETECTED
GC Probe Amp, Urine: NOT DETECTED

## 2015-02-12 LAB — HIV ANTIBODY (ROUTINE TESTING W REFLEX): HIV 1&2 Ab, 4th Generation: NONREACTIVE

## 2015-02-12 LAB — PSA: PSA: 1.38 ng/mL (ref 0.10–4.00)

## 2015-02-12 NOTE — Telephone Encounter (Signed)
Janan Ridge!    Thanks.

## 2015-02-12 NOTE — Telephone Encounter (Signed)
Caller name: Taras   Relationship to patient: Self   Can be reached: [phone]  Reason for call: Pt is calling in for his lab results.

## 2015-02-12 NOTE — Telephone Encounter (Signed)
Urine culture still pending. Will call once all lab results are received and reviewed by Dr. Larose Kells.

## 2015-02-13 ENCOUNTER — Encounter: Payer: Self-pay | Admitting: Internal Medicine

## 2015-02-13 LAB — URINE CULTURE
Colony Count: NO GROWTH
Organism ID, Bacteria: NO GROWTH

## 2015-02-13 MED ORDER — TAMSULOSIN HCL 0.4 MG PO CAPS: 0.4000 mg | ORAL_CAPSULE | Freq: Every day | ORAL | Status: DC

## 2015-02-13 NOTE — Addendum Note (Signed)
Addended by: Damita Dunnings D on: 02/13/2015 12:00 PM   Modules accepted: Orders

## 2015-02-16 ENCOUNTER — Ambulatory Visit (INDEPENDENT_AMBULATORY_CARE_PROVIDER_SITE_OTHER): Payer: BLUE CROSS/BLUE SHIELD | Admitting: Medical

## 2015-02-16 ENCOUNTER — Telehealth: Payer: Self-pay

## 2015-02-16 ENCOUNTER — Encounter: Payer: Self-pay | Admitting: Medical

## 2015-02-16 ENCOUNTER — Encounter: Payer: Self-pay | Admitting: Internal Medicine

## 2015-02-16 VITALS — BP 110/76 | HR 88 | Temp 98.3°F | Ht 65.0 in | Wt 139.0 lb

## 2015-02-16 DIAGNOSIS — R42 Dizziness and giddiness: Secondary | ICD-10-CM | POA: Diagnosis not present

## 2015-02-16 DIAGNOSIS — R1011 Right upper quadrant pain: Secondary | ICD-10-CM | POA: Diagnosis not present

## 2015-02-16 DIAGNOSIS — H538 Other visual disturbances: Secondary | ICD-10-CM

## 2015-02-16 DIAGNOSIS — R739 Hyperglycemia, unspecified: Secondary | ICD-10-CM

## 2015-02-16 DIAGNOSIS — R7309 Other abnormal glucose: Secondary | ICD-10-CM

## 2015-02-16 DIAGNOSIS — R531 Weakness: Secondary | ICD-10-CM

## 2015-02-16 LAB — GLUCOSE, POCT (MANUAL RESULT ENTRY): POC Glucose: 116 mg/dl — AB (ref 70–99)

## 2015-02-16 NOTE — Patient Instructions (Addendum)
Your dizziness was reported to be on standing up and ambulating. Your blood pressure is a little lower than it is usually. Flomax can have effect of lowering blood pressure. If this is case then you could feel dizzy. I discussed this with Dr. Larose Kells and thinks it is a good idea to hold flomax. Would recommend stopping for one week.   You can continue bactrim.  Your vision is good today on exam.  Your weakness could be associated with postural hypotension.  For your faint ruq pain on exam and you mild intermittent rt upper back pain, I will do labs cbc, amylase, lipase, and cmp. Recommend no alcohol for at least a week. If you have any worse abdomen pain then would recommend abdomen US.  For mild high sugar today. Will get 3 months bs average test today.  If you have any worse abdomen pain or any neurologic signs or symptoms then ED evaluation.  Return to work February 18, 2015. Follow up this Friday in morning or as needed

## 2015-02-16 NOTE — Telephone Encounter (Signed)
Patient in last week and treated for UTI. States medication he was given caused him to have nasal congestion,blurred vision and fever. Patient denies SOB and chest pains or rash. Appointment scheduled today with Mackie Pai Advised patient that if symptoms worsened and he developed SOB or Chest pains to go straight to ED. Patient agreed.

## 2015-02-16 NOTE — Progress Notes (Signed)
   Subjective:    Patient ID: Tyler [name], male    DOB: 03-07-1985, 29 y.o.   MRN: [id number]  HPI  Pt since this morning he feels dizzy, weak and blurred vision.  He states dizziness has been coming and going. He noticed this today mostly when he stands and ambulates. Then states dizziness sensation will subside after her ambulates. Dizziness was worse this morning.  Pt blood pressure is always on low side in the past.   Pt during interview no reported blurred vision.  Visual acuity each ey 20/25. Together 20/20.  Pt recently had bactrim. Pt does feel like he urinating better. Given 3 wk of bactrim. Pt is also on flomax.  Pt labs on last visit did not show any abnormalit on cbc. His psa was normal.  Pt drank 4 beers yesterday afternoon. Not that unusual. No abdomen pain that he complains of(But faint tenderness found on exam.    Review of Systems  Constitutional: Negative for fever, chills and fatigue.  Respiratory: Negative for cough, choking, chest tightness and wheezing.   Cardiovascular: Negative for chest pain and palpitations.  Gastrointestinal:       Faint minimal ruq pain found only on exam.   Musculoskeletal:       Miild on and of hx of rt  upper back area.   Neurological: Positive for dizziness.       See hpi  Hematological: Negative for adenopathy. Does not bruise/bleed easily.       Objective:   Physical Exam  General Mental Status- Alert. General Appearance- Not in acute distress.   Skin General: Color- Normal Color. Moisture- Normal Moisture.  Neck Carotid Arteries- Normal color. Moisture- Normal Moisture. No carotid bruits. No JVD.  Chest and Lung Exam Auscultation: Breath Sounds:-Normal. CTA  Cardiovascular Auscultation:Rythm- Regular. Murmurs & Other Heart Sounds:Auscultation of the heart reveals- No Murmurs.  Abdomen Inspection:-Inspeection Normal. Palpation/Percussion:Note:No mass. Palpation and Percussion of the abdomen reveal- very  faint ruq Tender, Non Distended + BS, no rebound or guarding  Back - no cva tenderness. No pain on palpation. But he does point to rt upper back medial to rt scapula as occasional rare brief source of mild pain.    Neurologic Cranial Nerve exam:- CN III-XII intact(No nystagmus), symmetric smile. Drift Test:- No drift. Finger to Nose:- Normal/Intact Strength:- 5/5 equal and symmetric strength both upper and lower extremities.      Assessment & Plan:  Your dizziness was reported to be on standing up and ambulating. Your blood pressure is a little lower than it is usually. Flomax can have effect on blood pressure(lower the bp). If this is case then you could feel dizzy. I discussed this with Tyler [name] and thinks it is a good idea to hold flomax. Would recommend stopping for one week.   Your vision is good today on exam.  Your weakness could be associated with hypotension.  For your faint ruq pain on exam and you mild intermittent rt upper back pain, I will do labs cbc, amylase, lipase, and cmp. Recommend no alcohol for at least a week. If you have any worse abdomen pain then would recommend abdomen US.  For mild high sugar today. Will get 3 months bs average test today.  If you have any worse abdomen pain or any neurologic signs or symptoms then ED evaluation

## 2015-02-16 NOTE — Progress Notes (Signed)
Pre visit review using our clinic review tool, if applicable. No additional management support is needed unless otherwise documented below in the visit note. 

## 2015-02-17 LAB — COMPREHENSIVE METABOLIC PANEL
ALBUMIN: 4.6 g/dL (ref 3.5–5.2)
ALK PHOS: 65 U/L (ref 39–117)
ALT: 30 U/L (ref 0–53)
AST: 18 U/L (ref 0–37)
BUN: 12 mg/dL (ref 6–23)
CO2: 29 mEq/L (ref 19–32)
Calcium: 9.9 mg/dL (ref 8.4–10.5)
Chloride: 100 mEq/L (ref 96–112)
Creatinine, Ser: 1.33 mg/dL (ref 0.40–1.50)
GFR: 67.37 mL/min (ref >60.00)
GLUCOSE: 89 mg/dL (ref 70–99)
POTASSIUM: 4.5 meq/L (ref 3.5–5.1)
Sodium: 136 mEq/L (ref 135–145)
TOTAL PROTEIN: 7.7 g/dL (ref 6.0–8.3)
Total Bilirubin: 0.7 mg/dL (ref 0.2–1.2)

## 2015-02-17 LAB — CBC WITH DIFFERENTIAL/PLATELET
BASOS PCT: 1.1 % (ref 0.0–3.0)
Basophils Absolute: 0.1 10*3/uL (ref 0.0–0.1)
EOS PCT: 2.3 % (ref 0.0–5.0)
Eosinophils Absolute: 0.1 10*3/uL (ref 0.0–0.7)
HCT: 48 % (ref 39.0–52.0)
Hemoglobin: 16 g/dL (ref 13.0–17.0)
LYMPHS ABS: 1.3 10*3/uL (ref 0.7–4.0)
Lymphocytes Relative: 20.9 % (ref 12.0–46.0)
MCHC: 33.3 g/dL (ref 30.0–36.0)
MCV: 90.3 fl (ref 78.0–100.0)
MONOS PCT: 12.3 % — AB (ref 3.0–12.0)
Monocytes Absolute: 0.8 10*3/uL (ref 0.1–1.0)
NEUTROS PCT: 63.4 % (ref 43.0–77.0)
Neutro Abs: 3.9 10*3/uL (ref 1.4–7.7)
Platelets: 215 10*3/uL (ref 150.0–400.0)
RBC: 5.31 Mil/uL (ref 4.22–5.81)
RDW: 12.7 % (ref 11.5–15.5)
WBC: 6.2 10*3/uL (ref 4.0–10.5)

## 2015-02-17 LAB — AMYLASE: AMYLASE: 96 U/L (ref 27–131)

## 2015-02-17 LAB — LIPASE: Lipase: 28 U/L (ref 11.0–59.0)

## 2015-02-17 LAB — HEMOGLOBIN A1C: Hgb A1c MFr Bld: 5.5 % (ref 4.6–6.5)

## 2015-02-24 ENCOUNTER — Telehealth: Payer: Self-pay

## 2015-02-24 NOTE — Telephone Encounter (Signed)
Appointment  changed for patient. States he still has urinary symptoms.

## 2015-02-25 NOTE — Telephone Encounter (Signed)
Patient is scheduled to see Percell Miller tomorrow.

## 2015-02-25 NOTE — Telephone Encounter (Signed)
Noted  

## 2015-02-25 NOTE — Telephone Encounter (Addendum)
Dr. Larose Kells out of office, recommend Pt to F/U w/ Percell Miller, or his PCP.

## 2015-02-26 ENCOUNTER — Encounter: Payer: Self-pay | Admitting: Medical

## 2015-02-26 ENCOUNTER — Ambulatory Visit (INDEPENDENT_AMBULATORY_CARE_PROVIDER_SITE_OTHER): Payer: BLUE CROSS/BLUE SHIELD | Admitting: Medical

## 2015-02-26 VITALS — BP 112/60 | HR 81 | Temp 97.9°F | Ht 65.0 in | Wt 138.0 lb

## 2015-02-26 DIAGNOSIS — R399 Unspecified symptoms and signs involving the genitourinary system: Secondary | ICD-10-CM

## 2015-02-26 MED ORDER — CIPROFLOXACIN HCL 500 MG PO TABS: 500.0000 mg | ORAL_TABLET | Freq: Two times a day (BID) | ORAL | Status: DC

## 2015-02-26 NOTE — Progress Notes (Signed)
Pre visit review using our clinic review tool, if applicable. No additional management support is needed unless otherwise documented below in the visit note. 

## 2015-02-26 NOTE — Patient Instructions (Addendum)
Your  urinary symptoms area improving slowly but you had to stop bactrim due to potential reaction. So I am going to rx you 14 days of cipro to see if your symptoms resolve completely.   I will go ahead and refer you to urologist as well for the symptoms you describe over past weeks.   Follow up with [place] in 4-6 weeks.

## 2015-02-26 NOTE — Progress Notes (Signed)
Subjective:    Patient ID: Tyler [name], male    DOB: 29-Apr-1985, 29 y.o.   MRN: [id number]  HPI  Pt in for follow up. Pt states 3 days after I saw him he had diffuse skin reaction, burning sensation and then rash. Pt ate at 1:30 pm and noticed rash at 5:30. No sob or wheezing. No light headed or near syncope. Rash was itching and spreading. Pt called emergency number. Pt took benadryl. He states he felt relaxed. Next morning no itch. He took benadryl for about a day and half and rash stopped. Pt went to UC in [place]. They speculated reaction could be to food or antibiotic. This event happened last Saturday. Pt stopped using the antibiotic on Saturday.  Pt states he has no burning on urination. He has sensation of having sensation of having urethral irritation. He states initial urinary symptoms are much better than they were in the beginning. Pt states he took 10 days of the bactrim before he had the rash. Also he thinks he has decreased force of urination.  Pt had vague weakness sensation on last time I saw him. I thought maybe side effect flomax. He stopped this medication and no recurrent weakness, dizziness or near syncope.(or other associated symptoms he had on last note)  Pt states Dr. Larose Kells did genital exam and DRE. He states no sign or symptoms other than above.    Review of Systems  Constitutional: Negative for fever, chills and fatigue.  Respiratory: Negative for cough, shortness of breath and wheezing.   Cardiovascular: Negative for chest pain and palpitations.  Gastrointestinal: Negative for abdominal pain, diarrhea, constipation, blood in stool and rectal pain.  Genitourinary: Negative for dysuria, urgency, hematuria, flank pain, penile swelling, penile pain and testicular pain.       Urethral irritation on urinating but not burning. See hpi.  Musculoskeletal: Negative for back pain.  Neurological: Negative for dizziness and headaches.  Hematological: Negative for  adenopathy. Does not bruise/bleed easily.  Psychiatric/Behavioral: Negative for behavioral problems and confusion.    Past Medical History  Diagnosis Date  . Other specified disorders of liver   . Tonsillitis     Social History   Social History  . Marital Status: Single    Spouse Name: N/A  . Number of Children: 0  . Years of Education: N/A   Occupational History  . Health and safety inspector    Social History Main Topics  . Smoking status: Current Every Day Smoker -- 0.50 packs/day for 10 years    Types: Cigarettes  . Smokeless tobacco: Never Used  . Alcohol Use: No     Comment: rarely   . Drug Use: No  . Sexual Activity:    Partners: Female   Other Topics Concern  . 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 No past surgeries      Family History  Problem Relation Age of Onset  . Hypertension Mother   . Kidney disease Paternal Uncle     Allergies  Allergen Reactions  . Bactrim [Sulfamethoxazole-Trimethoprim] Other (See Comments)    Skin reactions.     Current Outpatient Prescriptions on File Prior to Visit  Medication Sig Dispense Refill  . tamsulosin (FLOMAX) 0.4 MG CAPS capsule Take  1 capsule (0.4 mg total) by mouth at bedtime. (Patient not taking: Reported on 02/26/2015) 30 capsule 0   No current facility-administered medications on file prior to visit.    BP 112/60 mmHg  Pulse 81  Temp(Src) 97.9 F (36.6 C) (Oral)  Ht 5\' 5"  (1.651 m)  Wt 138 lb (62.596 kg)  BMI 22.96 kg/m2  SpO2 98%       Objective:   Physical Exam  General- No acute distress. Pleasant patient. Neck- Full range of motion, no jvd Lungs- Clear, even and unlabored. Heart- regular rate and rhythm. Neurologic- CNII- XII grossly intact.  Abdomen Inspection:-Inspection Normal.  Palpation/Perucssion: Palpation and  Percussion of the abdomen reveal- Non Tender, No Rebound tenderness, No rigidity(Guarding) and No Palpable abdominal masses.  Liver:-Normal.  Spleen:- Normal.   Back- no cva tenderness  Genital exam- deferred today.(reviewed last notes and exam done by Dr. Larose Kells and pt states full exam done as well)       Assessment & Plan:  Your  urinary symptoms area improving slowly but you had to stop bactrim due to potential reaction. So I am going to rx you 14 days of cipro to see if your symptoms resolve completely.   I will go ahead and refer you to urologist as well for the symptoms you describe over past weeks.   Follow up with [place] in 4-6 weeks.

## 2015-03-13 ENCOUNTER — Encounter: Payer: Self-pay | Admitting: Internal Medicine

## 2015-03-13 ENCOUNTER — Ambulatory Visit (INDEPENDENT_AMBULATORY_CARE_PROVIDER_SITE_OTHER): Payer: BLUE CROSS/BLUE SHIELD | Admitting: Internal Medicine

## 2015-03-13 VITALS — BP 118/74 | HR 75 | Temp 98.1°F | Ht 64.75 in | Wt 140.2 lb

## 2015-03-13 DIAGNOSIS — R399 Unspecified symptoms and signs involving the genitourinary system: Secondary | ICD-10-CM | POA: Diagnosis not present

## 2015-03-13 NOTE — Progress Notes (Signed)
Subjective:    Patient ID: Tyler [name], male    DOB: 1986-02-28, 30 y.o.   MRN: [id number]  DOS:  03/13/2015 Type of visit - description :  Follow-up Interval history: chart is reviewed, since the last time I saw him, he was seen at a UC elsewhere and twice at this office:  Apparently had a reaction to Bactrim, that improved. Developed dizziness, visual disturbances, weakness: Flomax was discontinued and now he feels better. He has taken a total of almost 4 weeks of antibiotics, LUTS are  better but not completely well: The patient has a hard time explaining his symptoms but the best he can tell is that "I can't push the urine  the way I used to".   Review of Systems  Denies dysuria perse, painful ejaculation, gross hematuria. She has some difficulty urinating but that is gone. Urinary frequency still present to some extent..  Past Medical History  Diagnosis Date  . Other specified disorders of liver   . Tonsillitis     Past Surgical History  Procedure Laterality Date  . No past surgeries      Social History   Social History  . Marital Status: Single    Spouse Name: N/A  . Number of Children: 0  . Years of Education: N/A   Occupational History  . Health and safety inspector    Social History Main Topics  . Smoking status: Current Every Day Smoker -- 0.50 packs/day for 10 years    Types: Cigarettes  . Smokeless tobacco: Never Used  . Alcohol Use: No     Comment: rarely   . Drug Use: No  . Sexual Activity:    Partners: Female   Other Topics Concern  . 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         Objective:   Physical Exam BP 118/74 mmHg  Pulse 75  Temp(Src) 98.1 F (36.7 C) (Oral)  Ht 5' 4.75" (1.645 m)  Wt 140 lb 4 oz (63.617  kg)  BMI 23.51 kg/m2  SpO2 98% General:   Well developed, well nourished . NAD.  HEENT:  Normocephalic . Face symmetric, atraumatic Abdomen:  Not distended, soft, non-tender but mild discomfort for upon palpation of the suprapubic area. No mass or rebound. skin: Not pale. Not jaundice Neurologic:  alert & oriented X3.  Speech normal, gait appropriate for age and unassisted Psych--  Cognition and judgment appear intact.  Cooperative with normal attention span and concentration.  Behavior appropriate. No anxious or depressed appearing.    Assessment & Plan:   LUTS: The patient asked me to review all the tests done over the last month,we did;  test are essentially normal, I did point out that the creatinine increased a little and I suggest that is recheck by his PCP when he comes back in few months. His PSA is wnl but we don't have a baseline After  4 weeks of antibiotics, I recommend him to stop them. He still has some ill defined symptoms,  Already has an appointment to see urology and i recommend to discuss issues w/ them . If sx  increase --> notify us.

## 2015-03-13 NOTE — Patient Instructions (Signed)
Call if the symptoms get worse otherwise see the urologist

## 2015-03-13 NOTE — Progress Notes (Signed)
Pre visit review using our clinic review tool, if applicable. No additional management support is needed unless otherwise documented below in the visit note. 

## 2015-03-31 ENCOUNTER — Encounter: Payer: Self-pay | Admitting: Family Medicine

## 2015-03-31 NOTE — Telephone Encounter (Signed)
We may need to see him.  i forwarded this to Annamarie Dawley but she sent it back to me--- I needed clarification.  I first response was to put socks on but I don't know exactly what he is asking.

## 2015-04-22 ENCOUNTER — Other Ambulatory Visit: Payer: Self-pay | Admitting: Urology

## 2015-04-22 DIAGNOSIS — N3644 Muscular disorders of urethra: Secondary | ICD-10-CM

## 2015-04-29 ENCOUNTER — Other Ambulatory Visit: Payer: Self-pay

## 2015-04-29 ENCOUNTER — Telehealth: Payer: Self-pay

## 2015-04-29 NOTE — Telephone Encounter (Signed)
Pre-visit call completed with  patient. 

## 2015-04-30 ENCOUNTER — Ambulatory Visit (HOSPITAL_COMMUNITY)
Admission: RE | Admit: 2015-04-30 | Discharge: 2015-04-30 | Disposition: A | Discharge status: Home / Self Care | Payer: BLUE CROSS/BLUE SHIELD | Source: Ambulatory Visit | Attending: Urology | Admitting: Urology

## 2015-04-30 ENCOUNTER — Telehealth: Payer: Self-pay | Admitting: Family Medicine

## 2015-04-30 ENCOUNTER — Encounter: Payer: BLUE CROSS/BLUE SHIELD | Admitting: Family Medicine

## 2015-04-30 DIAGNOSIS — N3289 Other specified disorders of bladder: Secondary | ICD-10-CM | POA: Diagnosis not present

## 2015-04-30 DIAGNOSIS — M50221 Other cervical disc displacement at C4-C5 level: Secondary | ICD-10-CM | POA: Diagnosis not present

## 2015-04-30 DIAGNOSIS — J341 Cyst and mucocele of nose and nasal sinus: Secondary | ICD-10-CM | POA: Diagnosis not present

## 2015-04-30 DIAGNOSIS — N3644 Muscular disorders of urethra: Secondary | ICD-10-CM

## 2015-04-30 DIAGNOSIS — M50222 Other cervical disc displacement at C5-C6 level: Secondary | ICD-10-CM | POA: Diagnosis not present

## 2015-04-30 MED ORDER — GADOBENATE DIMEGLUMINE 529 MG/ML IV SOLN
15.0000 mL | Freq: Once | INTRAVENOUS | Status: AC | PRN
  Administered 2015-04-30: 12 mL via INTRAVENOUS

## 2015-04-30 NOTE — Telephone Encounter (Signed)
charge 

## 2015-04-30 NOTE — Telephone Encounter (Signed)
Pt came in late at 4:20pm for 3:30pm appt, per Maudie Mercury rescheduled pt, first avail 06/19/15, charge or no charge?

## 2015-05-01 ENCOUNTER — Encounter: Payer: Self-pay | Admitting: Family Medicine

## 2015-05-01 NOTE — Telephone Encounter (Signed)
Marked to charge and mailing letter °

## 2015-05-04 ENCOUNTER — Telehealth: Payer: Self-pay | Admitting: Family Medicine

## 2015-05-04 NOTE — Telephone Encounter (Signed)
Pt called to speak with CMA/nurse. He has lost 8 lbs in the past 10 days. He does not know why the drastic change. He did have cold/cough. Pt request call [phone].

## 2015-05-04 NOTE — Telephone Encounter (Signed)
Spoke with patient and he stated he has been sick for the past few days and he has not been eating much but he is concerned because he has lost some weight and he would like to be seen. Apt scheduled for tomorrow.     KP

## 2015-05-05 ENCOUNTER — Ambulatory Visit (INDEPENDENT_AMBULATORY_CARE_PROVIDER_SITE_OTHER): Payer: BLUE CROSS/BLUE SHIELD | Admitting: Family Medicine

## 2015-05-05 VITALS — BP 98/64 | HR 81 | Temp 99.1°F | Ht 65.0 in | Wt 131.4 lb

## 2015-05-05 DIAGNOSIS — R17 Unspecified jaundice: Secondary | ICD-10-CM

## 2015-05-05 DIAGNOSIS — M545 Low back pain: Secondary | ICD-10-CM | POA: Diagnosis not present

## 2015-05-05 DIAGNOSIS — R32 Unspecified urinary incontinence: Secondary | ICD-10-CM

## 2015-05-05 DIAGNOSIS — J0140 Acute pansinusitis, unspecified: Secondary | ICD-10-CM

## 2015-05-05 LAB — POC URINALSYSI DIPSTICK (AUTOMATED)
BILIRUBIN UA: NEGATIVE
Glucose, UA: NEGATIVE
Nitrite, UA: NEGATIVE
PH UA: 6
RBC UA: NEGATIVE
Urobilinogen, UA: 2

## 2015-05-05 LAB — CBC WITH DIFFERENTIAL/PLATELET
Basophils Absolute: 0 10*3/uL (ref 0.0–0.1)
Basophils Relative: 0.3 % (ref 0.0–3.0)
Eosinophils Absolute: 0.1 10*3/uL (ref 0.0–0.7)
Eosinophils Relative: 1 % (ref 0.0–5.0)
HEMATOCRIT: 45.8 % (ref 39.0–52.0)
Hemoglobin: 15.9 g/dL (ref 13.0–17.0)
Lymphocytes Relative: 30.3 % (ref 12.0–46.0)
Lymphs Abs: 1.7 10*3/uL (ref 0.7–4.0)
MCHC: 34.6 g/dL (ref 30.0–36.0)
MCV: 88 fl (ref 78.0–100.0)
MONOS PCT: 7.7 % (ref 3.0–12.0)
Monocytes Absolute: 0.4 10*3/uL (ref 0.1–1.0)
NEUTROS ABS: 3.4 10*3/uL (ref 1.4–7.7)
Neutrophils Relative %: 60.7 % (ref 43.0–77.0)
PLATELETS: 151 10*3/uL (ref 150.0–400.0)
RBC: 5.21 Mil/uL (ref 4.22–5.81)
RDW: 12.2 % (ref 11.5–15.5)
WBC: 5.6 10*3/uL (ref 4.0–10.5)

## 2015-05-05 LAB — AMYLASE: Amylase: 58 U/L (ref 27–131)

## 2015-05-05 LAB — COMPREHENSIVE METABOLIC PANEL
ALT: 18 U/L (ref 0–53)
AST: 15 U/L (ref 0–37)
Albumin: 4.5 g/dL (ref 3.5–5.2)
Alkaline Phosphatase: 52 U/L (ref 39–117)
BUN: 12 mg/dL (ref 6–23)
CO2: 29 mEq/L (ref 19–32)
Calcium: 9.4 mg/dL (ref 8.4–10.5)
Chloride: 98 mEq/L (ref 96–112)
Creatinine, Ser: 1.19 mg/dL (ref 0.40–1.50)
GFR: 76.49 mL/min (ref >60.00)
GLUCOSE: 119 mg/dL — AB (ref 70–99)
POTASSIUM: 3.9 meq/L (ref 3.5–5.1)
Sodium: 136 mEq/L (ref 135–145)
Total Bilirubin: 0.7 mg/dL (ref 0.2–1.2)
Total Protein: 7.4 g/dL (ref 6.0–8.3)

## 2015-05-05 LAB — LIPASE: LIPASE: 18 U/L (ref 11.0–59.0)

## 2015-05-05 MED ORDER — LEVOCETIRIZINE DIHYDROCHLORIDE 5 MG PO TABS: 5.0000 mg | ORAL_TABLET | Freq: Every evening | ORAL | Status: DC

## 2015-05-05 MED ORDER — CEFDINIR 300 MG PO CAPS
300.0000 mg | ORAL_CAPSULE | Freq: Two times a day (BID) | ORAL | Status: DC
Start: 2015-05-05 — End: 2015-06-19

## 2015-05-05 MED ORDER — FLUTICASONE PROPIONATE 50 MCG/ACT NA SUSP: 2.0000 | Freq: Every day | NASAL | Status: DC

## 2015-05-05 MED ORDER — ALFUZOSIN HCL ER 10 MG PO TB24: 10.0000 mg | ORAL_TABLET | Freq: Every day | ORAL | Status: DC

## 2015-05-05 NOTE — Progress Notes (Signed)
Patient ID: Tyler [name], male    DOB: 1985/12/24  Age: 30 y.o. MRN: [id number]    Subjective:  Subjective HPI Tyler [name] presents for congestion and fever x 4 days.  Pt has been taking advil / tylenol for 3 days .    Review of Systems  Constitutional: Positive for fever, chills and unexpected weight change. Negative for diaphoresis, appetite change and fatigue.  HENT: Positive for congestion, postnasal drip, rhinorrhea, sinus pressure and sore throat.   Eyes: Negative for pain, redness and visual disturbance.  Respiratory: Negative for cough, chest tightness, shortness of breath and wheezing.   Cardiovascular: Negative for chest pain, palpitations and leg swelling.  Endocrine: Negative for cold intolerance, heat intolerance, polydipsia, polyphagia and polyuria.  Genitourinary: Negative for dysuria, frequency and difficulty urinating.  Allergic/Immunologic: Negative for environmental allergies.  Neurological: Negative for dizziness, light-headedness, numbness and headaches.    History Past Medical History  Diagnosis Date  . Other specified disorders of liver   . Tonsillitis     He has past surgical history that includes No past surgeries.   His family history includes Hypertension in his mother; Kidney disease in his paternal uncle.He reports that he has been smoking Cigarettes.  He has a 5 pack-year smoking history. He has never used smokeless tobacco. He reports that he does not drink alcohol or use illicit drugs.  No current outpatient prescriptions on file prior to visit.   No current facility-administered medications on file prior to visit.     Objective:  Objective Physical Exam  Constitutional: He is oriented to person, place, and time. Vital signs are normal. He appears well-developed and well-nourished. He is sleeping.  HENT:  Head: Normocephalic and atraumatic.  Right Ear: External ear normal.  Left Ear: External ear normal.  Nose: Right sinus exhibits  maxillary sinus tenderness and frontal sinus tenderness. Left sinus exhibits maxillary sinus tenderness and frontal sinus tenderness.  Mouth/Throat: Posterior oropharyngeal erythema present.  + PND + errythema  Eyes: Conjunctivae and EOM are normal. Pupils are equal, round, and reactive to light. Right eye exhibits no discharge. Left eye exhibits no discharge.  Neck: Normal range of motion. Neck supple. No thyromegaly present.  Cardiovascular: Normal rate, regular rhythm and normal heart sounds.   No murmur heard. Pulmonary/Chest: Effort normal and breath sounds normal. No respiratory distress. He has no wheezes. He has no rales. He exhibits no tenderness.  Musculoskeletal: He exhibits no edema or tenderness.  Lymphadenopathy:    He has cervical adenopathy.  Neurological: He is alert and oriented to person, place, and time.  Skin: Skin is warm and dry.  Psychiatric: He has a normal mood and affect. His behavior is normal. Judgment and thought content normal.  Nursing note and vitals reviewed.  BP 98/64 mmHg  Pulse 81  Temp(Src) 99.1 F (37.3 C) (Oral)  Ht 5' 5" (1.651 m)  Wt 131 lb 6.4 oz (59.603 kg)  BMI 21.87 kg/m2  SpO2 98% Wt Readings from Last 3 Encounters:  05/05/15 131 lb 6.4 oz (59.603 kg)  03/13/15 140 lb 4 oz (63.617 kg)  02/26/15 138 lb (62.596 kg)     Lab Results  Component Value Date   WBC 5.6 05/05/2015   HGB 15.9 05/05/2015   HCT 45.8 05/05/2015   PLT 151.0 05/05/2015   GLUCOSE 119* 05/05/2015   ALT 18 05/05/2015   AST 15 05/05/2015   NA 136 05/05/2015   K 3.9 05/05/2015   CL 98 05/05/2015   CREATININE  1.19 05/05/2015   BUN 12 05/05/2015   CO2 29 05/05/2015   PSA 1.38 02/11/2015   HGBA1C 5.5 02/16/2015    Mr Brain W Wo Contrast  05/01/2015  CLINICAL DATA:  Detrusor/sphincter dyssynergia. Question multiple sclerosis. EXAM: MRI HEAD WITHOUT AND WITH CONTRAST TECHNIQUE: Multiplanar, multiecho pulse sequences of the brain and surrounding structures were  obtained without and with intravenous contrast. CONTRAST:  12 mL MultiHance COMPARISON:  Cervical and lumbar MRI of the same day. FINDINGS: No acute infarct, hemorrhage, or mass lesion is present. The ventricles are of normal size. No significant extraaxial fluid collection is present. No significant white matter disease is present. The ventricles are of normal size. Internal auditory canals are within normal limits. The brainstem and cerebellum are normal. Flow is present in the major intracranial arteries. The globes and orbits are intact. A polyp or mucous retention cyst in the left maxillary sinus measures 2.4 cm maximally. The paranasal sinuses and mastoid air cells are otherwise clear. The skullbase is within normal limits. Midline sagittal images are unremarkable. The postcontrast images demonstrate no pathologic enhancement. IMPRESSION: 1. Normal MRI appearance of the brain. No significant white matter disease. 2. 2.4 cm polyp or mucous retention cyst within the left maxillary sinus. Electronically Signed   By: San Morelle M.D.   On: 05/01/2015 08:09   Mr Cervical Spine W Wo Contrast  05/01/2015  CLINICAL DATA:  Question multiple sclerosis. Detrusor/sphincter dyssynergia. Initial encounter. EXAM: MRI CERVICAL SPINE WITHOUT AND WITH CONTRAST TECHNIQUE: Multiplanar and multiecho pulse sequences of the cervical spine, to include the craniocervical junction and cervicothoracic junction, were obtained according to standard protocol without and with intravenous contrast. CONTRAST:  12 cc MultiHance IV. COMPARISON:  Brain MRI and lumbar spine MRI this same day reviewed. FINDINGS: Vertebral body height, signal and alignment are normal. The craniocervical junction is normal. The cervical cord demonstrates normal signal. There is no pathologic enhancement after contrast administration. Imaged paraspinous structures are unremarkable. C2-3:  Negative. C3-4: Minimal disc bulge and uncovertebral disease without  central canal or foraminal stenosis. C4-5: Shallow disc bulge nearly effaces the ventral thecal sac. The foramina are widely patent. C5-6: The patient has a small left paracentral protrusion which effaces the ventral thecal sac. The foramina are widely patent. C6-7: Negative. C7-T1:  Negative. IMPRESSION: Negative for demyelinating disease. The cervical cord appears normal. Shallow disc bulge at C4-5 and shallow central protrusion C5-6 efface the ventral thecal sac but the central canal and foramina appear open at both levels. Electronically Signed   By: Inge Rise M.D.   On: 05/01/2015 08:23   Mr Lumbar Spine W Wo Contrast  05/01/2015  CLINICAL DATA:  Detrusor/sphincter dyssynergia. Question multiple sclerosis. EXAM: MRI LUMBAR SPINE WITHOUT AND WITH CONTRAST TECHNIQUE: Multiplanar and multiecho pulse sequences of the lumbar spine were obtained without and with intravenous contrast. CONTRAST:  78m MULTIHANCE GADOBENATE DIMEGLUMINE 529 MG/ML IV SOLN COMPARISON:  MRI brain and MRI of the cervical spine from the same day. FINDINGS: Normal signal is present in the conus medullaris which terminates at L1. Marrow signal, vertebral body heights, and alignment are normal. Limited imaging of the abdomen is unremarkable. There is mild distention of the urinary bladder. Bladder wall thickening is suggested as well. No focal disc protrusion or stenosis is present in the lumbar spine. The foramina are patent bilaterally. No significant facet disease is evident. The postcontrast images demonstrate no pathologic enhancement. IMPRESSION: 1. Normal MRI appearance of the lumbar spine without and with contrast. 2.  Mild distension of the urinary bladder. Mild wall thickening is evident. Electronically Signed   By: San Morelle M.D.   On: 05/01/2015 08:12     Assessment & Plan:  Plan I have discontinued Mr. Achterberg tamsulosin. I am also having him start on alfuzosin, cefdinir, fluticasone, and  levocetirizine.  Meds ordered this encounter  Medications  . DISCONTD: tamsulosin (FLOMAX) 0.4 MG CAPS capsule    Sig: Take 1 capsule by mouth daily.    Refill:  0  . alfuzosin (UROXATRAL) 10 MG 24 hr tablet    Sig: Take 1 tablet (10 mg total) by mouth daily with breakfast. Per urology  . cefdinir (OMNICEF) 300 MG capsule    Sig: Take 1 capsule (300 mg total) by mouth 2 (two) times daily.    Dispense:  20 capsule    Refill:  0  . fluticasone (FLONASE) 50 MCG/ACT nasal spray    Sig: Place 2 sprays into both nostrils daily.    Dispense:  16 g    Refill:  6  . levocetirizine (XYZAL) 5 MG tablet    Sig: Take 1 tablet (5 mg total) by mouth every evening.    Dispense:  30 tablet    Refill:  5    Problem List Items Addressed This Visit    None    Visit Diagnoses    Acute pansinusitis, recurrence not specified    -  Primary    Relevant Medications    cefdinir (OMNICEF) 300 MG capsule    fluticasone (FLONASE) 50 MCG/ACT nasal spray    levocetirizine (XYZAL) 5 MG tablet    Low back pain without sciatica, unspecified back pain laterality        Relevant Orders    POCT Urinalysis Dipstick (Automated) (Completed)    Urine Culture    Incontinence        Relevant Medications    alfuzosin (UROXATRAL) 10 MG 24 hr tablet    Icterus        Relevant Orders    Comp Met (CMET) (Completed)    CBC with Differential/Platelet (Completed)    Amylase (Completed)    Lipase (Completed)       Follow-up: Return if symptoms worsen or fail to improve.  Garnet Koyanagi, DO

## 2015-05-05 NOTE — Progress Notes (Signed)
Pre visit review using our clinic review tool, if applicable. No additional management support is needed unless otherwise documented below in the visit note. 

## 2015-05-05 NOTE — Patient Instructions (Signed)

## 2015-05-06 ENCOUNTER — Encounter: Payer: Self-pay | Admitting: Family Medicine

## 2015-05-08 LAB — URINE CULTURE: Colony Count: 50000

## 2015-06-19 ENCOUNTER — Encounter: Payer: Self-pay | Admitting: Family Medicine

## 2015-06-19 ENCOUNTER — Ambulatory Visit (INDEPENDENT_AMBULATORY_CARE_PROVIDER_SITE_OTHER): Payer: BLUE CROSS/BLUE SHIELD | Admitting: Family Medicine

## 2015-06-19 VITALS — BP 98/60 | HR 84 | Temp 98.5°F | Ht 65.0 in | Wt 138.6 lb

## 2015-06-19 DIAGNOSIS — K921 Melena: Secondary | ICD-10-CM | POA: Diagnosis not present

## 2015-06-19 DIAGNOSIS — Z0000 Encounter for general adult medical examination without abnormal findings: Secondary | ICD-10-CM | POA: Diagnosis not present

## 2015-06-19 NOTE — Progress Notes (Signed)
Patient ID: INGEMAR NUCKOLLS, male    DOB: [birth year]  Age: 30 y.o. MRN: [id number]    Subjective:  Subjective HPI Tyler [name] presents for cpe.  At the end of the visit after cpe and he was dressed he mentioned he was still having the side pain he had and said he was having blood in stools.   No other complaints.  Pain is the same that it has been  Review of Systems  Constitutional: Negative.   HENT: Negative for congestion, ear pain, hearing loss, nosebleeds, postnasal drip, rhinorrhea, sinus pressure, sneezing and tinnitus.   Eyes: Negative for photophobia, discharge, itching and visual disturbance.  Respiratory: Negative.   Cardiovascular: Negative.   Gastrointestinal: Positive for blood in stool. Negative for abdominal pain, constipation, abdominal distention and anal bleeding.  Endocrine: Negative.   Genitourinary: Negative.   Musculoskeletal: Negative.   Skin: Negative.   Allergic/Immunologic: Negative.   Neurological: Negative for dizziness, weakness, light-headedness, numbness and headaches.  Psychiatric/Behavioral: Negative for suicidal ideas, confusion, sleep disturbance, dysphoric mood, decreased concentration and agitation. The patient is not nervous/anxious.     History Past Medical History  Diagnosis Date  . Other specified disorders of liver   . Tonsillitis   . Urinary dysfunction     per [facility]  . History of hemorrhoids     history of since child    He has past surgical history that includes No past surgeries.   His family history includes Hypertension in his mother; Kidney disease in his paternal uncle.He reports that he has been smoking Cigarettes.  He has a 5 pack-year smoking history. He has never used smokeless tobacco. He reports that he does not drink alcohol or use illicit drugs.  No current outpatient prescriptions on file prior to visit.   No current facility-administered medications on file prior to visit.     Objective:    Objective Physical Exam  Constitutional: He is oriented to person, place, and time. He appears well-developed and well-nourished. No distress.  HENT:  Head: Normocephalic and atraumatic.  Right Ear: External ear normal.  Left Ear: External ear normal.  Nose: Nose normal.  Mouth/Throat: Oropharynx is clear and moist. No oropharyngeal exudate.  Eyes: Conjunctivae and EOM are normal. Pupils are equal, round, and reactive to light. Right eye exhibits no discharge. Left eye exhibits no discharge.  Neck: Normal range of motion. Neck supple. No JVD present. No thyromegaly present.  Cardiovascular: Normal rate, regular rhythm and intact distal pulses.  Exam reveals no gallop and no friction rub.   No murmur heard. Pulmonary/Chest: Effort normal and breath sounds normal. No respiratory distress. He has no wheezes. He has no rales. He exhibits no tenderness.  Abdominal: Soft. Bowel sounds are normal. He exhibits no distension and no mass. There is no tenderness. There is no rebound and no guarding.  Genitourinary:  Per urology  Musculoskeletal: Normal range of motion. He exhibits no edema or tenderness.  Lymphadenopathy:    He has no cervical adenopathy.  Neurological: He is alert and oriented to person, place, and time. He displays normal reflexes. He exhibits normal muscle tone.  Skin: Skin is warm and dry. No rash noted. He is not diaphoretic. No erythema. No pallor.  Psychiatric: He has a normal mood and affect. His behavior is normal. Judgment and thought content normal.  Nursing note and vitals reviewed.  BP 98/60 mmHg  Pulse 84  Temp(Src) 98.5 F (36.9 C) (Oral)  Ht 5\' 5"  (1.651 m)  Wt 138 lb 9.6 oz (62.869 kg)  BMI 23.06 kg/m2  SpO2 98% Wt Readings from Last 3 Encounters:  06/19/15 138 lb 9.6 oz (62.869 kg)  05/05/15 131 lb 6.4 oz (59.603 kg)  03/13/15 140 lb 4 oz (63.617 kg)     Lab Results  Component Value Date   WBC 5.6 05/05/2015   HGB 15.9 05/05/2015   HCT 45.8  05/05/2015   PLT 151.0 05/05/2015   GLUCOSE 119* 05/05/2015   ALT 18 05/05/2015   AST 15 05/05/2015   NA 136 05/05/2015   K 3.9 05/05/2015   CL 98 05/05/2015   CREATININE 1.19 05/05/2015   BUN 12 05/05/2015   CO2 29 05/05/2015   PSA 1.38 02/11/2015   HGBA1C 5.5 02/16/2015    Mr Brain W Wo Contrast  05/01/2015  CLINICAL DATA:  Detrusor/sphincter dyssynergia. Question multiple sclerosis. EXAM: MRI HEAD WITHOUT AND WITH CONTRAST TECHNIQUE: Multiplanar, multiecho pulse sequences of the brain and surrounding structures were obtained without and with intravenous contrast. CONTRAST:  12 mL MultiHance COMPARISON:  Cervical and lumbar MRI of the same day. FINDINGS: No acute infarct, hemorrhage, or mass lesion is present. The ventricles are of normal size. No significant extraaxial fluid collection is present. No significant white matter disease is present. The ventricles are of normal size. Internal auditory canals are within normal limits. The brainstem and cerebellum are normal. Flow is present in the major intracranial arteries. The globes and orbits are intact. A polyp or mucous retention cyst in the left maxillary sinus measures 2.4 cm maximally. The paranasal sinuses and mastoid air cells are otherwise clear. The skullbase is within normal limits. Midline sagittal images are unremarkable. The postcontrast images demonstrate no pathologic enhancement. IMPRESSION: 1. Normal MRI appearance of the brain. No significant white matter disease. 2. 2.4 cm polyp or mucous retention cyst within the left maxillary sinus. Electronically Signed   By: San Morelle M.D.   On: 05/01/2015 08:09   Mr Cervical Spine W Wo Contrast  05/01/2015  CLINICAL DATA:  Question multiple sclerosis. Detrusor/sphincter dyssynergia. Initial encounter. EXAM: MRI CERVICAL SPINE WITHOUT AND WITH CONTRAST TECHNIQUE: Multiplanar and multiecho pulse sequences of the cervical spine, to include the craniocervical junction and  cervicothoracic junction, were obtained according to standard protocol without and with intravenous contrast. CONTRAST:  12 cc MultiHance IV. COMPARISON:  Brain MRI and lumbar spine MRI this same day reviewed. FINDINGS: Vertebral body height, signal and alignment are normal. The craniocervical junction is normal. The cervical cord demonstrates normal signal. There is no pathologic enhancement after contrast administration. Imaged paraspinous structures are unremarkable. C2-3:  Negative. C3-4: Minimal disc bulge and uncovertebral disease without central canal or foraminal stenosis. C4-5: Shallow disc bulge nearly effaces the ventral thecal sac. The foramina are widely patent. C5-6: The patient has a small left paracentral protrusion which effaces the ventral thecal sac. The foramina are widely patent. C6-7: Negative. C7-T1:  Negative. IMPRESSION: Negative for demyelinating disease. The cervical cord appears normal. Shallow disc bulge at C4-5 and shallow central protrusion C5-6 efface the ventral thecal sac but the central canal and foramina appear open at both levels. Electronically Signed   By: Inge Rise M.D.   On: 05/01/2015 08:23   Mr Lumbar Spine W Wo Contrast  05/01/2015  CLINICAL DATA:  Detrusor/sphincter dyssynergia. Question multiple sclerosis. EXAM: MRI LUMBAR SPINE WITHOUT AND WITH CONTRAST TECHNIQUE: Multiplanar and multiecho pulse sequences of the lumbar spine were obtained without and with intravenous contrast. CONTRAST:  50mL MULTIHANCE GADOBENATE  DIMEGLUMINE 529 MG/ML IV SOLN COMPARISON:  MRI brain and MRI of the cervical spine from the same day. FINDINGS: Normal signal is present in the conus medullaris which terminates at L1. Marrow signal, vertebral body heights, and alignment are normal. Limited imaging of the abdomen is unremarkable. There is mild distention of the urinary bladder. Bladder wall thickening is suggested as well. No focal disc protrusion or stenosis is present in the lumbar  spine. The foramina are patent bilaterally. No significant facet disease is evident. The postcontrast images demonstrate no pathologic enhancement. IMPRESSION: 1. Normal MRI appearance of the lumbar spine without and with contrast. 2. Mild distension of the urinary bladder. Mild wall thickening is evident. Electronically Signed   By: San Morelle M.D.   On: 05/01/2015 08:12     Assessment & Plan:  Plan I have discontinued Mr. Cao alfuzosin, cefdinir, fluticasone, and levocetirizine. I am also having him maintain his tamsulosin.  Meds ordered this encounter  Medications  . tamsulosin (FLOMAX) 0.4 MG CAPS capsule    Sig: Take 0.4 mg by mouth.    Problem List Items Addressed This Visit      Unprioritized   Preventative health care - Primary    See AVS ghm utd      Relevant Orders   Comprehensive metabolic panel   CBC with Differential/Platelet   Lipid panel   POCT urinalysis dipstick   TSH   Blood in stool    Per pt  Hx hemorrhoids-- per pt -- rectal not done because he had been seeing urology and he did not c/o blood and ? Hemorrhoids until after we were finished but he c/o blood-- if he does have hemorrhoids I advised he try otc meds -- ie prep H, Tucks Refer back to GI         Follow-up: Return if symptoms worsen or fail to improve, for annual exam, fasting.  Ann Held, DO

## 2015-06-19 NOTE — Patient Instructions (Signed)

## 2015-06-19 NOTE — Progress Notes (Signed)
Pre visit review using our clinic review tool, if applicable. No additional management support is needed unless otherwise documented below in the visit note. 

## 2015-06-21 DIAGNOSIS — Z0000 Encounter for general adult medical examination without abnormal findings: Secondary | ICD-10-CM | POA: Insufficient documentation

## 2015-06-21 DIAGNOSIS — K921 Melena: Secondary | ICD-10-CM | POA: Insufficient documentation

## 2015-06-21 NOTE — Assessment & Plan Note (Signed)
See AVS ghm utd 

## 2015-06-21 NOTE — Assessment & Plan Note (Signed)
Per pt  Hx hemorrhoids-- per pt -- rectal not done because he had been seeing urology and he did not c/o blood and ? Hemorrhoids until after we were finished but he c/o blood-- if he does have hemorrhoids I advised he try otc meds -- ie prep H, Tucks Refer back to GI

## 2015-06-26 ENCOUNTER — Other Ambulatory Visit: Payer: Self-pay | Admitting: Family Medicine

## 2015-06-26 ENCOUNTER — Ambulatory Visit (INDEPENDENT_AMBULATORY_CARE_PROVIDER_SITE_OTHER): Payer: BLUE CROSS/BLUE SHIELD | Admitting: Behavioral Health

## 2015-06-26 ENCOUNTER — Other Ambulatory Visit (INDEPENDENT_AMBULATORY_CARE_PROVIDER_SITE_OTHER): Payer: BLUE CROSS/BLUE SHIELD

## 2015-06-26 DIAGNOSIS — Z0000 Encounter for general adult medical examination without abnormal findings: Secondary | ICD-10-CM

## 2015-06-26 DIAGNOSIS — Z23 Encounter for immunization: Secondary | ICD-10-CM | POA: Diagnosis not present

## 2015-06-26 LAB — LIPID PANEL
CHOL/HDL RATIO: 4.1 ratio (ref <=5.0)
Cholesterol: 193 mg/dL (ref 125–200)
HDL: 47 mg/dL (ref >=40)
LDL CALC: 78 mg/dL (ref <130)
Triglycerides: 338 mg/dL — ABNORMAL HIGH (ref <150)
VLDL: 68 mg/dL — AB (ref <30)

## 2015-06-26 LAB — CBC WITH DIFFERENTIAL/PLATELET
BASOS ABS: 61 {cells}/uL (ref 0–200)
BASOS PCT: 1 %
EOS ABS: 183 {cells}/uL (ref 15–500)
Eosinophils Relative: 3 %
HEMATOCRIT: 44.2 % (ref 38.5–50.0)
Hemoglobin: 15.2 g/dL (ref 13.2–17.1)
Lymphocytes Relative: 29 %
Lymphs Abs: 1769 cells/uL (ref 850–3900)
MCH: 30.6 pg (ref 27.0–33.0)
MCHC: 34.4 g/dL (ref 32.0–36.0)
MCV: 89.1 fL (ref 80.0–100.0)
MONO ABS: 366 {cells}/uL (ref 200–950)
MPV: 9.4 fL (ref 7.5–12.5)
Monocytes Relative: 6 %
NEUTROS ABS: 3721 {cells}/uL (ref 1500–7800)
Neutrophils Relative %: 61 %
Platelets: 209 10*3/uL (ref 140–400)
RBC: 4.96 MIL/uL (ref 4.20–5.80)
RDW: 13.3 % (ref 11.0–15.0)
WBC: 6.1 10*3/uL (ref 3.8–10.8)

## 2015-06-26 LAB — COMPREHENSIVE METABOLIC PANEL
ALBUMIN: 4.2 g/dL (ref 3.6–5.1)
ALT: 39 U/L (ref 9–46)
AST: 23 U/L (ref 10–40)
Alkaline Phosphatase: 52 U/L (ref 40–115)
BUN: 10 mg/dL (ref 7–25)
CALCIUM: 8.9 mg/dL (ref 8.6–10.3)
CHLORIDE: 101 mmol/L (ref 98–110)
CO2: 28 mmol/L (ref 20–31)
CREATININE: 1.01 mg/dL (ref 0.60–1.35)
GLUCOSE: 149 mg/dL — AB (ref 65–99)
Potassium: 3.9 mmol/L (ref 3.5–5.3)
SODIUM: 138 mmol/L (ref 135–146)
Total Bilirubin: 0.7 mg/dL (ref 0.2–1.2)
Total Protein: 6.8 g/dL (ref 6.1–8.1)

## 2015-06-26 LAB — POCT URINALYSIS DIPSTICK
BILIRUBIN UA: NEGATIVE
Glucose, UA: NEGATIVE
KETONES UA: NEGATIVE
Leukocytes, UA: NEGATIVE
Nitrite, UA: NEGATIVE
PH UA: 6
Protein, UA: 7.5
RBC UA: NEGATIVE
SPEC GRAV UA: 1.02
Urobilinogen, UA: 0.2

## 2015-06-26 LAB — TSH: TSH: 0.78 m[IU]/L (ref 0.40–4.50)

## 2015-06-26 NOTE — Addendum Note (Signed)
Addended by: Peggyann Shoals on: 06/26/2015 03:46 PM   Modules accepted: Orders

## 2015-06-26 NOTE — Progress Notes (Signed)
Pre visit review using our clinic review tool, if applicable. No additional management support is needed unless otherwise documented below in the visit note.  Patient came in office today for Tdap vaccination. Einar Pheasant, PA-C gave verbal to administer vaccine. IM given in Left Deltoid. Patient tolerated injection well.

## 2015-06-29 LAB — HEMOGLOBIN A1C
HEMOGLOBIN A1C: 5.6 % (ref <5.7)
Mean Plasma Glucose: 114 mg/dL

## 2015-07-03 ENCOUNTER — Ambulatory Visit (HOSPITAL_BASED_OUTPATIENT_CLINIC_OR_DEPARTMENT_OTHER): Payer: BLUE CROSS/BLUE SHIELD

## 2015-07-04 ENCOUNTER — Ambulatory Visit (HOSPITAL_BASED_OUTPATIENT_CLINIC_OR_DEPARTMENT_OTHER)
Admission: RE | Admit: 2015-07-04 | Discharge: 2015-07-04 | Disposition: A | Discharge status: Home / Self Care | Payer: BLUE CROSS/BLUE SHIELD | Source: Ambulatory Visit | Attending: Family Medicine | Admitting: Family Medicine

## 2015-07-04 DIAGNOSIS — R071 Chest pain on breathing: Secondary | ICD-10-CM | POA: Diagnosis present

## 2015-07-04 DIAGNOSIS — R937 Abnormal findings on diagnostic imaging of other parts of musculoskeletal system: Secondary | ICD-10-CM | POA: Diagnosis not present

## 2015-07-31 ENCOUNTER — Encounter: Payer: Self-pay | Admitting: Family Medicine

## 2015-08-03 ENCOUNTER — Other Ambulatory Visit: Payer: Self-pay | Admitting: Family Medicine

## 2015-08-03 DIAGNOSIS — R9389 Abnormal findings on diagnostic imaging of other specified body structures: Secondary | ICD-10-CM

## 2015-08-03 NOTE — Telephone Encounter (Signed)
-----   Message from Ann Held, DO sent at 08/03/2015  5:34 PM EDT ----- Spoke with radiology --- MRI chest was recommended

## 2015-08-03 NOTE — Telephone Encounter (Signed)
Patient has been made aware and verbalized understanding, He has agreed to the MRI and will wait for the return call.     KP

## 2015-08-08 ENCOUNTER — Ambulatory Visit (HOSPITAL_BASED_OUTPATIENT_CLINIC_OR_DEPARTMENT_OTHER)
Admission: RE | Admit: 2015-08-08 | Discharge: 2015-08-08 | Disposition: A | Discharge status: Home / Self Care | Payer: BLUE CROSS/BLUE SHIELD | Source: Ambulatory Visit | Attending: Family Medicine | Admitting: Family Medicine

## 2015-08-08 DIAGNOSIS — R938 Abnormal findings on diagnostic imaging of other specified body structures: Secondary | ICD-10-CM | POA: Diagnosis present

## 2015-08-08 DIAGNOSIS — R9389 Abnormal findings on diagnostic imaging of other specified body structures: Secondary | ICD-10-CM

## 2015-08-10 ENCOUNTER — Telehealth: Payer: Self-pay | Admitting: *Deleted

## 2015-08-10 DIAGNOSIS — M949 Disorder of cartilage, unspecified: Secondary | ICD-10-CM

## 2015-08-10 DIAGNOSIS — R0789 Other chest pain: Secondary | ICD-10-CM

## 2015-08-10 NOTE — Telephone Encounter (Signed)
-----   Message from Ann Held, [place] sent at 08/09/2015  4:08 PM EDT ----- Area on prior CT -- benign and chronic-- cartilege Can try to see if sport med can help

## 2015-08-10 NOTE — Telephone Encounter (Signed)
Notified pt and he states he continues to have discomfort over the sternum area when he stretches and would like to pursue sports med referral. I have pended referral. Please review and sign.

## 2015-08-10 NOTE — Telephone Encounter (Signed)
Ok to refer.

## 2015-08-11 ENCOUNTER — Ambulatory Visit (INDEPENDENT_AMBULATORY_CARE_PROVIDER_SITE_OTHER): Payer: BLUE CROSS/BLUE SHIELD | Admitting: Family Medicine

## 2015-08-11 ENCOUNTER — Encounter: Payer: Self-pay | Admitting: Family Medicine

## 2015-08-11 VITALS — BP 92/60 | HR 78 | Temp 99.6°F | Wt 139.2 lb

## 2015-08-11 DIAGNOSIS — J029 Acute pharyngitis, unspecified: Secondary | ICD-10-CM

## 2015-08-11 LAB — POCT RAPID STREP A (OFFICE): RAPID STREP A SCREEN: NEGATIVE

## 2015-08-11 MED ORDER — LEVOCETIRIZINE DIHYDROCHLORIDE 5 MG PO TABS: 5.0000 mg | ORAL_TABLET | Freq: Every evening | ORAL | Status: DC

## 2015-08-11 MED ORDER — AMOXICILLIN 875 MG PO TABS
875.0000 mg | ORAL_TABLET | Freq: Two times a day (BID) | ORAL | Status: DC
Start: 2015-08-11 — End: 2016-06-06

## 2015-08-11 NOTE — Progress Notes (Signed)
Pre visit review using our clinic review tool, if applicable. No additional management support is needed unless otherwise documented below in the visit note. 

## 2015-08-11 NOTE — Telephone Encounter (Signed)
Ref has been placed.     KP 

## 2015-08-11 NOTE — Progress Notes (Signed)
Patient ID: Tyler [name], male    DOB: 02/11/1986  Age: 30 y.o. MRN: [id number]    Subjective:  Subjective HPI Tyler [name] presents for c/o uri and sore throat since Saturday.  His ears were bothering him but not now.      Review of Systems  Constitutional: Positive for fever and chills. Negative for diaphoresis, appetite change, fatigue and unexpected weight change.  HENT: Positive for congestion, postnasal drip and sore throat. Negative for rhinorrhea and sinus pressure.   Eyes: Negative for pain, redness and visual disturbance.  Respiratory: Positive for cough. Negative for chest tightness, shortness of breath and wheezing.   Cardiovascular: Negative for chest pain, palpitations and leg swelling.  Endocrine: Negative for cold intolerance, heat intolerance, polydipsia, polyphagia and polyuria.  Genitourinary: Negative for dysuria, frequency and difficulty urinating.  Allergic/Immunologic: Negative for environmental allergies.  Neurological: Negative for dizziness, light-headedness, numbness and headaches.    History Past Medical History  Diagnosis Date  . Other specified disorders of liver   . Tonsillitis   . Urinary dysfunction     per [facility]  . History of hemorrhoids     history of since child    He has past surgical history that includes No past surgeries.   His family history includes Hypertension in his mother; Kidney disease in his paternal uncle.He reports that he has been smoking Cigarettes.  He has a 5 pack-year smoking history. He has never used smokeless tobacco. He reports that he does not drink alcohol or use illicit drugs.  Current Outpatient Prescriptions on File Prior to Visit  Medication Sig Dispense Refill  . tamsulosin (FLOMAX) 0.4 MG CAPS capsule Take 0.4 mg by mouth.     No current facility-administered medications on file prior to visit.     Objective:  Objective Physical Exam  Constitutional: He is oriented to person, place,  and time. He appears well-developed and well-nourished.  HENT:  Right Ear: External ear normal.  Left Ear: External ear normal.  Mouth/Throat: Posterior oropharyngeal erythema present.  + PND + errythema  Eyes: Conjunctivae are normal. Right eye exhibits no discharge. Left eye exhibits no discharge.  Cardiovascular: Normal rate, regular rhythm and normal heart sounds.   No murmur heard. Pulmonary/Chest: Effort normal and breath sounds normal. No respiratory distress. He has no wheezes. He has no rales. He exhibits no tenderness.  Musculoskeletal: He exhibits no edema.  Lymphadenopathy:    He has cervical adenopathy.  Neurological: He is alert and oriented to person, place, and time.  Psychiatric: He has a normal mood and affect. His behavior is normal. Judgment and thought content normal.  Nursing note and vitals reviewed.  BP 92/60 mmHg  Pulse 78  Temp(Src) 99.6 F (37.6 C) (Oral)  Wt 139 lb 3.2 oz (63.141 kg)  SpO2 98% Wt Readings from Last 3 Encounters:  08/11/15 139 lb 3.2 oz (63.141 kg)  06/19/15 138 lb 9.6 oz (62.869 kg)  05/05/15 131 lb 6.4 oz (59.603 kg)     Lab Results  Component Value Date   WBC 6.1 06/26/2015   HGB 15.2 06/26/2015   HCT 44.2 06/26/2015   PLT 209 06/26/2015   GLUCOSE 149* 06/26/2015   CHOL 193 06/26/2015   TRIG 338* 06/26/2015   HDL 47 06/26/2015   LDLCALC 78 06/26/2015   ALT 39 06/26/2015   AST 23 06/26/2015   NA 138 06/26/2015   K 3.9 06/26/2015   CL 101 06/26/2015   CREATININE 1.01 06/26/2015  BUN 10 06/26/2015   CO2 28 06/26/2015   TSH 0.78 06/26/2015   PSA 1.38 02/11/2015   HGBA1C 5.6 06/26/2015    Mr Chest Wo Contrast  08/09/2015  CLINICAL DATA:  Sternal lesion on CT. Costosternal discomfort for 2 years. EXAM: MRI CHEST WITHOUT CONTRAST TECHNIQUE: Multiplanar, multisequence MR imaging was performed. No intravenous contrast was administered. COMPARISON:  07/04/2015 FINDINGS: Sternoclavicular joints and costosternal junctions  normal. The cartilaginous portion of the ribs as they attach to the sternum appear normal. No adenopathy along the internal mammary vessels. Attention was raised to a slight lucency at the sternomanubrial junction on prior CT. This is probably a chronic rest of cartilage or small incidental benign lesion and similar findings are frequently encountered in normal patients. I do not think this to be a significant or worrisome lesion. There is no surrounding marrow edema and this "lesion" appears chronic. No abnormality of the regional pectoralis musculature. IMPRESSION: 1. No significant lesion or abnormality involving the sternum is identified. The small lucency at the manubriosternal junction on the prior CT appears chronic and benign, and may represent a small cartilaginous rest along the apophysis. Electronically Signed   By: Van Clines M.D.   On: 08/09/2015 12:19     Assessment & Plan:  Plan I am having Mr. Nickum start on amoxicillin and levocetirizine. I am also having him maintain his tamsulosin.  Meds ordered this encounter  Medications  . amoxicillin (AMOXIL) 875 MG tablet    Sig: Take 1 tablet (875 mg total) by mouth 2 (two) times daily.    Dispense:  20 tablet    Refill:  0  . levocetirizine (XYZAL) 5 MG tablet    Sig: Take 1 tablet (5 mg total) by mouth every evening.    Dispense:  30 tablet    Refill:  5    Problem List Items Addressed This Visit    None    Visit Diagnoses    Sore throat    -  Primary    Relevant Medications    amoxicillin (AMOXIL) 875 MG tablet    levocetirizine (XYZAL) 5 MG tablet    Other Relevant Orders    POCT rapid strep A (Completed)    Culture, group A strep       Follow-up: Return if symptoms worsen or fail to improve.  Ann Held, DO

## 2015-08-11 NOTE — Patient Instructions (Signed)

## 2015-08-12 NOTE — Addendum Note (Signed)
Addended by: Caffie Pinto on: 08/12/2015 01:25 PM   Modules accepted: Orders

## 2015-08-13 ENCOUNTER — Ambulatory Visit: Payer: BLUE CROSS/BLUE SHIELD | Admitting: Family Medicine

## 2015-08-14 LAB — CULTURE, GROUP A STREP: ORGANISM ID, BACTERIA: NORMAL

## 2015-08-25 ENCOUNTER — Encounter: Payer: Self-pay | Admitting: Family Medicine

## 2015-08-25 ENCOUNTER — Ambulatory Visit (INDEPENDENT_AMBULATORY_CARE_PROVIDER_SITE_OTHER): Payer: BLUE CROSS/BLUE SHIELD | Admitting: Family Medicine

## 2015-08-25 VITALS — BP 101/67 | HR 72 | Ht 65.0 in | Wt 140.0 lb

## 2015-08-25 DIAGNOSIS — R0789 Other chest pain: Secondary | ICD-10-CM | POA: Diagnosis not present

## 2015-08-25 NOTE — Patient Instructions (Signed)
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 This makes it more susceptible to movement you otherwise wouldn't have, fluid will pop within the cyst. This is not dangerous. No restrictions on your activities. Focus on bench press, incline bench press, shoulder flys 3 sets of 10 once a day 2-3 times a week at least. I don't think medications, ice/heat will help that much. Call me if you have any questions.

## 2015-08-27 DIAGNOSIS — R079 Chest pain, unspecified: Secondary | ICD-10-CM | POA: Insufficient documentation

## 2015-08-27 NOTE — Progress Notes (Signed)
PCP and consultation requested by: Ann Held, DO  Subjective:   HPI: Patient is a 30 y.o. male here for chest pain.  Patient reports for 2 years he's had some discomfort superior sternal area. Feels it mainly when stretching arms completely backwards. Can get popping in this location. Pain is 0/10, when comes on is dull. Difficulty doing pullups because this bothers him. Saw chiropractor for 3 months without benefit. Better with rest. No skin changes, numbness. No shortness of breath.  Past Medical History  Diagnosis Date  . Other specified disorders of liver   . Tonsillitis   . Urinary dysfunction     per [facility]  . History of hemorrhoids     history of since child    Current Outpatient Prescriptions on File Prior to Visit  Medication Sig Dispense Refill  . amoxicillin (AMOXIL) 875 MG tablet Take 1 tablet (875 mg total) by mouth 2 (two) times daily. 20 tablet 0  . levocetirizine (XYZAL) 5 MG tablet Take 1 tablet (5 mg total) by mouth every evening. 30 tablet 5  . tamsulosin (FLOMAX) 0.4 MG CAPS capsule Take 0.4 mg by mouth.     No current facility-administered medications on file prior to visit.    Past Surgical History  Procedure Laterality Date  . No past surgeries      Allergies  Allergen Reactions  . Bactrim [Sulfamethoxazole-Trimethoprim] Other (See Comments)    Skin reactions.     Social History   Social History  . Marital Status: Married    Spouse Name: N/A  . Number of Children: 0  . Years of Education: N/A   Occupational History  . Health and safety inspector    Social History Main Topics  . Smoking status: Current Every Day Smoker -- 0.50 packs/day for 10 years    Types: Cigarettes  . Smokeless tobacco: Never Used  . Alcohol Use: No     Comment: rarely   . Drug Use: No  . Sexual Activity:    Partners: Female   Other Topics Concern  . Not on file   Social History Narrative   Single, no children   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[employer]    uses alcohol no drug use   Updated 08/06/2013             Family History  Problem Relation Age of Onset  . Hypertension Mother   . Kidney disease Paternal Uncle     BP 101/67 mmHg  Pulse 72  Ht 5\' 5"  (1.651 m)  Wt 140 lb (63.504 kg)  BMI 23.30 kg/m2  Review of Systems: See HPI above.    Objective:  Physical Exam:  Gen: NAD, comfortable in exam room  Chest: Expands symmetrically. No gross deformity, swelling, bruising. No tenderness to palpation currently including sternum, sternocostal joints, sternoclavicular joints.   Unable to reproduce popping with full motions of extension, flexion of shoulders, rotation of thoracic spine.    Assessment & Plan:  1. Chest pain - independently reviewed CT and MRI of chest - both are reassuring.  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  This would predispose this area to some weakness and movement he otherwise wouldn't have.  Not dangerous, rest of exam and imaging is reassuring.  Discussed focusing on pectoralis strengthening with bench press, incline bench, shoulder fly exercises.  Medication, ice/heat unlikely to be beneficial.  F/u prn.

## 2015-08-27 NOTE — Assessment & Plan Note (Signed)
independently reviewed CT and MRI of chest - both are reassuring.  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  This would predispose this area to some weakness and movement he otherwise wouldn't have.  Not dangerous, rest of exam and imaging is reassuring.  Discussed focusing on pectoralis strengthening with bench press, incline bench, shoulder fly exercises.  Medication, ice/heat unlikely to be beneficial.  F/u prn.

## 2016-01-04 ENCOUNTER — Telehealth: Payer: Self-pay | Admitting: Family Medicine

## 2016-01-04 NOTE — Telephone Encounter (Signed)
°  Relation to [id number] Call back number:[phone]   Reason for call:  Patient requesting a dermatologist referral due to hair loss, please respond via my chart.

## 2016-01-05 ENCOUNTER — Encounter: Payer: Self-pay | Admitting: Family Medicine

## 2016-01-06 ENCOUNTER — Telehealth: Payer: Self-pay

## 2016-01-06 DIAGNOSIS — L659 Nonscarring hair loss, unspecified: Secondary | ICD-10-CM

## 2016-01-06 NOTE — Telephone Encounter (Signed)
Referral enter for Dermatologist. LB

## 2016-01-12 ENCOUNTER — Encounter: Payer: Self-pay | Admitting: Family Medicine

## 2016-01-12 ENCOUNTER — Ambulatory Visit (INDEPENDENT_AMBULATORY_CARE_PROVIDER_SITE_OTHER): Payer: BLUE CROSS/BLUE SHIELD | Admitting: Family Medicine

## 2016-01-12 DIAGNOSIS — L659 Nonscarring hair loss, unspecified: Secondary | ICD-10-CM

## 2016-01-12 DIAGNOSIS — R350 Frequency of micturition: Secondary | ICD-10-CM | POA: Diagnosis not present

## 2016-01-12 DIAGNOSIS — Z23 Encounter for immunization: Secondary | ICD-10-CM | POA: Diagnosis not present

## 2016-01-12 DIAGNOSIS — Z0000 Encounter for general adult medical examination without abnormal findings: Secondary | ICD-10-CM | POA: Diagnosis not present

## 2016-01-12 NOTE — Patient Instructions (Signed)
Alopecia Areata Alopecia areata is a type of hair loss. If you have this condition, you may lose hair on your scalp in patches. In some cases, you may lose all the hair on your scalp (alopecia totalis) or all the hair from your face and body (alopecia universalis).  Alopecia areata is an autoimmune disease. This means your body's defense system (immune system) mistakes normal parts of your body for germs or other things that can make you sick. When you have alopecia areata, your immune system attacks your hair follicles.  Alopecia areata often starts during childhood but can occur at any age. Alopecia areata is not a danger to your health but can be stressful.  CAUSES  The cause of alopecia areata is unknown.  RISK FACTORS You may be at higher risk of alopecia areata if you:   Have a family history of alopecia.  Have a family history of another autoimmune disease, including type 1 diabetes and rheumatoid arthritis. SIGNS AND SYMPTOMS Signs of alopecia areata may include:  Loss of scalp hair in small, round patches. These may be about the size of a quarter.  Loss of all hair on your scalp.  Loss of eyebrow hair, facial hair, or the hair inside your nose (nasal hair).  Hair loss over your entire body. DIAGNOSIS  Alopecia areata may be diagnosed by:  Medical history and physical exam.  Taking a sample of hair to check under a microscope.  Taking a small piece of skin (biopsy) to examine under a microscope.  Blood tests to rule out other autoimmune diseases. TREATMENT  There is no cure for alopecia areata, but the disease often goes away over time. You will not lose the ability to regrow hair. Some medicines may help your hair regrow more quickly. These include:  Corticosteroids. These block inflammation caused by your immune system. You may get this medicine as a lotion for your skin or as an injection.  Minoxidil. This is a hair growth medicine you can use in areas of hair  loss.  Anthralin. This is a medicine for a skin inflammation called psoriasis that may also help alopecia.  Diphencyprone. This medicine is applied to your skin and may stimulate hair growth. HOME CARE INSTRUCTIONS  Use sunscreen or cover your head when outdoors.  Take medicines only as directed by your health care provider.  If you have lost your eyebrows, wear sunglasses outside to keep dust out of your eyes.  If you have lost hair inside your nose, wear a kerchief over your face or apply ointment to the inside of your nose. This keeps out dust and other irritants.  Keep all follow-up visits as directed by your health care provider. This is important. SEEK MEDICAL CARE IF:  Your symptoms change.  You have new symptoms.  You have a reaction to your medicines.  You are struggling emotionally. This information is not intended to replace advice given to you by your health care provider. Make sure you discuss any questions you have with your health care provider. Document Released: 09/19/2003 Document Revised: 03/07/2014 Document Reviewed: 05/06/2013 Elsevier Interactive Patient Education  2017 [employer]  

## 2016-01-12 NOTE — Progress Notes (Signed)
Pre visit review using our clinic review tool, if applicable. No additional management support is needed unless otherwise documented below in the visit note. 

## 2016-01-12 NOTE — Progress Notes (Signed)
Patient ID: Tyler [name], male    DOB: 01-28-1986  Age: 30 y.o. MRN: [id number]    Subjective:  Subjective  HPI Tyler [name] presents for hair loss -- he is requesting a derm referral  He also is asking [place] about going to a chiropractor for his bladder -- chiropractor said he could help- he didn't realize he could make his own appointment  Review of Systems  Constitutional: Negative for appetite change, diaphoresis, fatigue and unexpected weight change.  Eyes: Negative for pain, redness and visual disturbance.  Respiratory: Negative for cough, chest tightness, shortness of breath and wheezing.   Cardiovascular: Negative for chest pain, palpitations and leg swelling.  Endocrine: Negative for cold intolerance, heat intolerance, polydipsia, polyphagia and polyuria.  Genitourinary: Negative for difficulty urinating, dysuria and frequency.  Neurological: Negative for dizziness, light-headedness, numbness and headaches.    History Past Medical History:  Diagnosis Date  . History of hemorrhoids    history of since child  . Other specified disorders of liver   . Tonsillitis   . Urinary dysfunction    per [facility]    He has a past surgical history that includes No past surgeries.   His family history includes Hypertension in his mother; Kidney disease in his paternal uncle.He reports that he has been smoking Cigarettes.  He has a 5.00 pack-year smoking history. He has never used smokeless tobacco. He reports that he does not drink alcohol or use drugs.  Current Outpatient Prescriptions on File Prior to Visit  Medication Sig Dispense Refill  . amoxicillin (AMOXIL) 875 MG tablet Take 1 tablet (875 mg total) by mouth 2 (two) times daily. (Patient not taking: Reported on 01/12/2016) 20 tablet 0  . levocetirizine (XYZAL) 5 MG tablet Take 1 tablet (5 mg total) by mouth every evening. (Patient not taking: Reported on 01/12/2016) 30 tablet 5  . tamsulosin (FLOMAX) 0.4 MG CAPS  capsule Take 0.4 mg by mouth.     No current facility-administered medications on file prior to visit.      Objective:  Objective  Physical Exam  Constitutional: He is oriented to person, place, and time. Vital signs are normal. He appears well-developed and well-nourished. He is sleeping.  HENT:  Head: Normocephalic and atraumatic.  Mouth/Throat: Oropharynx is clear and moist.  Eyes: EOM are normal. Pupils are equal, round, and reactive to light.  Neck: Normal range of motion. Neck supple. No thyromegaly present.  Cardiovascular: Normal rate and regular rhythm.   No murmur heard. Pulmonary/Chest: Effort normal and breath sounds normal. No respiratory distress. He has no wheezes. He has no rales. He exhibits no tenderness.  Musculoskeletal: He exhibits no edema or tenderness.  Neurological: He is alert and oriented to person, place, and time.  Skin: Skin is warm and dry.  Psychiatric: He has a normal mood and affect. His behavior is normal. Judgment and thought content normal.   BP (!) 110/58 (BP Location: Right Arm, Patient Position: Sitting, Cuff Size: Normal)   Pulse 86   Temp 99.5 F (37.5 C)   Resp 16   Ht 5\' 5"  (1.651 m)   Wt 142 lb (64.4 kg)   SpO2 97%   BMI 23.63 kg/m  Wt Readings from Last 3 Encounters:  01/12/16 142 lb (64.4 kg)  08/25/15 140 lb (63.5 kg)  08/11/15 139 lb 3.2 oz (63.1 kg)     Lab Results  Component Value Date   WBC 6.1 06/26/2015   HGB 15.2 06/26/2015   HCT  44.2 06/26/2015   PLT 209 06/26/2015   GLUCOSE 149 (H) 06/26/2015   CHOL 193 06/26/2015   TRIG 338 (H) 06/26/2015   HDL 47 06/26/2015   LDLCALC 78 06/26/2015   ALT 39 06/26/2015   AST 23 06/26/2015   NA 138 06/26/2015   K 3.9 06/26/2015   CL 101 06/26/2015   CREATININE 1.01 06/26/2015   BUN 10 06/26/2015   CO2 28 06/26/2015   TSH 0.78 06/26/2015   PSA 1.38 02/11/2015   HGBA1C 5.6 06/26/2015    Mr Chest Wo Contrast  Result Date: 08/09/2015 CLINICAL DATA:  Sternal lesion on  CT. Costosternal discomfort for 2 years. EXAM: MRI CHEST WITHOUT CONTRAST TECHNIQUE: Multiplanar, multisequence MR imaging was performed. No intravenous contrast was administered. COMPARISON:  07/04/2015 FINDINGS: Sternoclavicular joints and costosternal junctions normal. The cartilaginous portion of the ribs as they attach to the sternum appear normal. No adenopathy along the internal mammary vessels. Attention was raised to a slight lucency at the sternomanubrial junction on prior CT. This is probably a chronic rest of cartilage or small incidental benign lesion and similar findings are frequently encountered in normal patients. I do not think this to be a significant or worrisome lesion. There is no surrounding marrow edema and this "lesion" appears chronic. No abnormality of the regional pectoralis musculature. IMPRESSION: 1. No significant lesion or abnormality involving the sternum is identified. The small lucency at the manubriosternal junction on the prior CT appears chronic and benign, and may represent a small cartilaginous rest along the apophysis. Electronically Signed   By: Van Clines M.D.   On: 08/09/2015 12:19     Assessment & Plan:  Plan  I am having Tyler [name] maintain his tamsulosin, amoxicillin, and levocetirizine.  No orders of the defined types were placed in this encounter.   Problem List Items Addressed This Visit      Unprioritized   Hair loss    Derm app pending      Urinary frequency    F/u urology Pt will see chiropractor       Other Visit Diagnoses    Encounter for immunization       Relevant Orders   Flu Vaccine QUAD 36+ mos IM (Completed)      Follow-up: Return if symptoms worsen or fail to improve.  Ann Held, DO

## 2016-01-13 DIAGNOSIS — R350 Frequency of micturition: Secondary | ICD-10-CM | POA: Insufficient documentation

## 2016-01-13 DIAGNOSIS — L659 Nonscarring hair loss, unspecified: Secondary | ICD-10-CM | POA: Insufficient documentation

## 2016-01-13 NOTE — Assessment & Plan Note (Signed)
Derm app pending

## 2016-01-13 NOTE — Assessment & Plan Note (Signed)
F/u urology Pt will see chiropractor

## 2016-03-22 NOTE — Telephone Encounter (Signed)
Enter error. LB 

## 2016-06-06 ENCOUNTER — Ambulatory Visit (HOSPITAL_BASED_OUTPATIENT_CLINIC_OR_DEPARTMENT_OTHER)
Admission: RE | Admit: 2016-06-06 | Discharge: 2016-06-06 | Disposition: A | Discharge status: Home / Self Care | Payer: BLUE CROSS/BLUE SHIELD | Source: Ambulatory Visit | Attending: Family Medicine | Admitting: Family Medicine

## 2016-06-06 ENCOUNTER — Ambulatory Visit (INDEPENDENT_AMBULATORY_CARE_PROVIDER_SITE_OTHER): Payer: BLUE CROSS/BLUE SHIELD | Admitting: Family Medicine

## 2016-06-06 ENCOUNTER — Encounter: Payer: Self-pay | Admitting: Family Medicine

## 2016-06-06 VITALS — BP 100/68 | HR 70 | Temp 98.1°F | Resp 16 | Ht 65.0 in | Wt 144.0 lb

## 2016-06-06 DIAGNOSIS — M940 Chondrocostal junction syndrome [Tietze]: Secondary | ICD-10-CM | POA: Diagnosis not present

## 2016-06-06 NOTE — Progress Notes (Signed)
Patient ID: Tyler [name], male   DOB: December 22, 1985, 31 y.o.   MRN: [id number]    Subjective:  I acted as a Education administrator for Dr. Carollee Herter.  [name], [place]   Patient ID: Tyler [name], male    DOB: 07-27-1985, 31 y.o.   MRN: [id number]  Chief Complaint  Patient presents with  . rib pressure right side    HPI  Patient is in today for pressure under right rib for 1-2 weeks.  No pain just pressure.  The pressure comes and goes.  Eating does not make it worse.  Noticed it first thing in the morning and after work.  Pt is the same pain he has with his sternum.  He was told there was nothing on xray or exam and sport med told him it was something he would have to live with but it has now moved and he wants it checked.    Patient Care Team: Ann Held, DO as PCP - General (Family Medicine)   Past Medical History:  Diagnosis Date  . History of hemorrhoids    history of since child  . Other specified disorders of liver   . Tonsillitis   . Urinary dysfunction    per [facility]    Past Surgical History:  Procedure Laterality Date  . NO PAST SURGERIES      Family History  Problem Relation Age of Onset  . Hypertension Mother   . Kidney disease Paternal Uncle     Social History   Social History  . Marital status: Married    Spouse name: N/A  . Number of children: 0  . Years of education: N/A   Occupational History  . Health and safety inspector    Social History Main Topics  . Smoking status: Current Every Day Smoker    Packs/day: 0.50    Years: 10.00    Types: Cigarettes  . Smokeless tobacco: Never Used  . Alcohol use No     Comment: rarely   . Drug use: No  . Sexual activity: Not Currently    Partners: Female   Other Topics Concern  . Not on file   Social History Narrative   Single, no children   Financial planner [employer]   uses alcohol no drug use   Updated 08/06/2013             Outpatient Medications Prior to Visit  Medication Sig Dispense  Refill  . amoxicillin (AMOXIL) 875 MG tablet Take 1 tablet (875 mg total) by mouth 2 (two) times daily. (Patient not taking: Reported on 01/12/2016) 20 tablet 0  . levocetirizine (XYZAL) 5 MG tablet Take 1 tablet (5 mg total) by mouth every evening. (Patient not taking: Reported on 01/12/2016) 30 tablet 5  . tamsulosin (FLOMAX) 0.4 MG CAPS capsule Take 0.4 mg by mouth.     No facility-administered medications prior to visit.     Allergies  Allergen Reactions  . Bactrim [Sulfamethoxazole-Trimethoprim] Other (See Comments)    Skin reactions.     Review of Systems  Constitutional: Negative for fever and malaise/fatigue.  HENT: Negative for congestion.   Eyes: Negative for blurred vision.  Respiratory: Negative for cough and shortness of breath.   Cardiovascular: Negative for chest pain, palpitations and leg swelling.  Gastrointestinal: Negative for abdominal pain, blood in stool, constipation, diarrhea, melena, nausea and vomiting.  Musculoskeletal: Negative for back pain.  Skin: Negative for rash.  Neurological: Negative for loss of consciousness and headaches.  Objective:    Physical Exam  Constitutional: He appears well-developed and well-nourished. No distress.  HENT:  Head: Normocephalic and atraumatic.  Eyes: Conjunctivae are normal.  Neck: Normal range of motion. No thyromegaly present.  Cardiovascular: Normal rate and regular rhythm.   Pulmonary/Chest: Effort normal and breath sounds normal. No respiratory distress. He has no wheezes. He has no rales. He exhibits no tenderness.  Abdominal: Soft. Bowel sounds are normal. He exhibits no distension and no mass. There is no tenderness. There is no rebound and no guarding.  Musculoskeletal: Normal range of motion. He exhibits no edema or deformity.  Neurological: He is alert.  Skin: Skin is warm and dry. He is not diaphoretic.  Psychiatric: He has a normal mood and affect. His behavior is normal. Judgment and thought  content normal.  Nursing note and vitals reviewed.   BP 100/68 (BP Location: Left Arm, Cuff Size: Normal)   Pulse 70   Temp 98.1 F (36.7 C) (Oral)   Resp 16   Ht 5\' 5"  (1.651 m)   Wt 144 lb (65.3 kg)   SpO2 98%   BMI 23.96 kg/m  Wt Readings from Last 3 Encounters:  06/06/16 144 lb (65.3 kg)  01/12/16 142 lb (64.4 kg)  08/25/15 140 lb (63.5 kg)   BP Readings from Last 3 Encounters:  06/06/16 100/68  01/12/16 (!) 110/58  08/25/15 101/67     Immunization History  Administered Date(s) Administered  . Influenza,inj,Quad PF,36+ Mos 01/12/2016  . Tdap 06/26/2015    Health Maintenance  Topic Date Due  . INFLUENZA VACCINE  09/28/2016  . TETANUS/TDAP  06/25/2025  . HIV Screening  Completed    Lab Results  Component Value Date   WBC 6.1 06/26/2015   HGB 15.2 06/26/2015   HCT 44.2 06/26/2015   PLT 209 06/26/2015   GLUCOSE 149 (H) 06/26/2015   CHOL 193 06/26/2015   TRIG 338 (H) 06/26/2015   HDL 47 06/26/2015   LDLCALC 78 06/26/2015   ALT 39 06/26/2015   AST 23 06/26/2015   NA 138 06/26/2015   K 3.9 06/26/2015   CL 101 06/26/2015   CREATININE 1.01 06/26/2015   BUN 10 06/26/2015   CO2 28 06/26/2015   TSH 0.78 06/26/2015   PSA 1.38 02/11/2015   HGBA1C 5.6 06/26/2015    Lab Results  Component Value Date   TSH 0.78 06/26/2015   Lab Results  Component Value Date   WBC 6.1 06/26/2015   HGB 15.2 06/26/2015   HCT 44.2 06/26/2015   MCV 89.1 06/26/2015   PLT 209 06/26/2015   Lab Results  Component Value Date   NA 138 06/26/2015   K 3.9 06/26/2015   CO2 28 06/26/2015   GLUCOSE 149 (H) 06/26/2015   BUN 10 06/26/2015   CREATININE 1.01 06/26/2015   BILITOT 0.7 06/26/2015   ALKPHOS 52 06/26/2015   AST 23 06/26/2015   ALT 39 06/26/2015   PROT 6.8 06/26/2015   ALBUMIN 4.2 06/26/2015   CALCIUM 8.9 06/26/2015   GFR 76.49 05/05/2015   Lab Results  Component Value Date   CHOL 193 06/26/2015   Lab Results  Component Value Date   HDL 47 06/26/2015   Lab  Results  Component Value Date   LDLCALC 78 06/26/2015   Lab Results  Component Value Date   TRIG 338 (H) 06/26/2015   Lab Results  Component Value Date   CHOLHDL 4.1 06/26/2015   Lab Results  Component Value Date   HGBA1C 5.6  06/26/2015         Assessment & Plan:   Problem List Items Addressed This Visit    None    Visit Diagnoses    Costochondritis    -  Primary   Relevant Orders   DG Chest 2 View             Pain comes and goes---CT and mRI showed 49mm hypoechoic area c/w cyst in manubriosternal sychondrosis---pain feels the same as when he was c/o sternal pain It just moved Will get xray but did explain to pt that I felt it was probably the same pain== pt wanted xray done I have discontinued Mr. [place]rond tamsulosin, amoxicillin, and levocetirizine.  No orders of the defined types were placed in this encounter.   CMA served as Education administrator during this visit. History, Physical and Plan performed by medical provider. Documentation and orders reviewed and attested to.  Ann Held, DO

## 2016-06-06 NOTE — Progress Notes (Signed)
Pre visit review using our clinic review tool, if applicable. No additional management support is needed unless otherwise documented below in the visit note. 

## 2016-06-06 NOTE — Patient Instructions (Signed)
Costochondritis Costochondritis is swelling and irritation (inflammation) of the tissue (cartilage) that connects your ribs to your breastbone (sternum). This causes pain in the front of your chest. The pain usually starts gradually and involves more than one rib. What are the causes? The exact cause of this condition is not always known. It results from stress on the cartilage where your ribs attach to your sternum. The cause of this stress could be:  Chest injury (trauma).  Exercise or activity, such as lifting.  Severe coughing. What increases the risk? You may be at higher risk for this condition if you:  Are male.  Are 30?31 years old.  Recently started a new exercise or work activity.  Have low levels of vitamin D.  Have a condition that makes you cough frequently. What are the signs or symptoms? The main symptom of this condition is chest pain. The pain:  Usually starts gradually and can be sharp or dull.  Gets worse with deep breathing, coughing, or exercise.  Gets better with rest.  May be worse when you press on the sternum-rib connection (tenderness). How is this diagnosed? This condition is diagnosed based on your symptoms, medical history, and a physical exam. Your health care provider will check for tenderness when pressing on your sternum. This is the most important finding. You may also have tests to rule out other causes of chest pain. These may include:  A chest X-ray to check for lung problems.  An electrocardiogram (ECG) to see if you have a heart problem that could be causing the pain.  An imaging scan to rule out a chest or rib fracture. How is this treated? This condition usually goes away on its own over time. Your health care provider may prescribe an NSAID to reduce pain and inflammation. Your health care provider may also suggest that you:  Rest and avoid activities that make pain worse.  Apply heat or cold to the area to reduce pain and  inflammation.  Do exercises to stretch your chest muscles. If these treatments do not help, your health care provider may inject a numbing medicine at the sternum-rib connection to help relieve the pain. Follow these instructions at home:  Avoid activities that make pain worse. This includes any activities that use chest, abdominal, and side muscles.  If directed, put ice on the painful area:  Put ice in a plastic bag.  Place a towel between your skin and the bag.  Leave the ice on for 20 minutes, 2-3 times a day.  If directed, apply heat to the affected area as often as told by your health care provider. Use the heat source that your health care provider recommends, such as a moist heat pack or a heating pad.  Place a towel between your skin and the heat source.  Leave the heat on for 20-30 minutes.  Remove the heat if your skin turns bright red. This is especially important if you are unable to feel pain, heat, or cold. You may have a greater risk of getting burned.  Take over-the-counter and prescription medicines only as told by your health care provider.  Return to your normal activities as told by your health care provider. Ask your health care provider what activities are safe for you.  Keep all follow-up visits as told by your health care provider. This is important. Contact a health care provider if:  You have chills or a fever.  Your pain does not go away or it gets worse.    You have a cough that does not go away (is persistent). Get help right away if:  You have shortness of breath. This information is not intended to replace advice given to you by your health care provider. Make sure you discuss any questions you have with your health care provider. Document Released: 11/24/2004 Document Revised: 09/04/2015 Document Reviewed: 06/10/2015 Elsevier Interactive Patient Education  2017 [employer]  

## 2016-07-18 ENCOUNTER — Encounter: Payer: Self-pay | Admitting: Family Medicine

## 2016-07-19 ENCOUNTER — Ambulatory Visit (INDEPENDENT_AMBULATORY_CARE_PROVIDER_SITE_OTHER): Payer: BLUE CROSS/BLUE SHIELD | Admitting: Family Medicine

## 2016-07-19 ENCOUNTER — Encounter: Payer: Self-pay | Admitting: Family Medicine

## 2016-07-19 VITALS — BP 106/70 | HR 83 | Temp 98.4°F | Resp 16 | Ht 65.0 in | Wt 144.0 lb

## 2016-07-19 DIAGNOSIS — J029 Acute pharyngitis, unspecified: Secondary | ICD-10-CM

## 2016-07-19 DIAGNOSIS — J069 Acute upper respiratory infection, unspecified: Secondary | ICD-10-CM

## 2016-07-19 LAB — POCT RAPID STREP A (OFFICE): RAPID STREP A SCREEN: NEGATIVE

## 2016-07-19 MED ORDER — AMOXICILLIN 875 MG PO TABS: 875.0000 mg | ORAL_TABLET | Freq: Two times a day (BID) | ORAL | 0 refills | Status: DC

## 2016-07-19 MED ORDER — LEVOCETIRIZINE DIHYDROCHLORIDE 5 MG PO TABS: 5.0000 mg | ORAL_TABLET | Freq: Every evening | ORAL | 5 refills | Status: DC

## 2016-07-19 MED ORDER — FLUTICASONE PROPIONATE 50 MCG/ACT NA SUSP: 2.0000 | Freq: Every day | NASAL | 6 refills | Status: DC

## 2016-07-19 NOTE — Patient Instructions (Signed)
Upper Respiratory Infection, Adult Most upper respiratory infections (URIs) are caused by a virus. A URI affects the nose, throat, and upper air passages. The most common type of URI is often called "the common cold." Follow these instructions at home:  Take medicines only as told by your doctor.  Gargle warm saltwater or take cough drops to comfort your throat as told by your doctor.  Use a warm mist humidifier or inhale steam from a shower to increase air moisture. This may make it easier to breathe.  Drink enough fluid to keep your pee (urine) clear or pale yellow.  Eat soups and other clear broths.  Have a healthy diet.  Rest as needed.  Go back to work when your fever is gone or your doctor says it is okay.  You may need to stay home longer to avoid giving your URI to others.  You can also wear a face mask and wash your hands often to prevent spread of the virus.  Use your inhaler more if you have asthma.  Do not use any tobacco products, including cigarettes, chewing tobacco, or electronic cigarettes. If you need help quitting, ask your doctor. Contact a doctor if:  You are getting worse, not better.  Your symptoms are not helped by medicine.  You have chills.  You are getting more short of breath.  You have brown or red mucus.  You have yellow or brown discharge from your nose.  You have pain in your face, especially when you bend forward.  You have a fever.  You have puffy (swollen) neck glands.  You have pain while swallowing.  You have white areas in the back of your throat. Get help right away if:  You have very bad or constant:  Headache.  Ear pain.  Pain in your forehead, behind your eyes, and over your cheekbones (sinus pain).  Chest pain.  You have long-lasting (chronic) lung disease and any of the following:  Wheezing.  Long-lasting cough.  Coughing up blood.  A change in your usual mucus.  You have a stiff neck.  You have  changes in your:  Vision.  Hearing.  Thinking.  Mood. This information is not intended to replace advice given to you by your health care provider. Make sure you discuss any questions you have with your health care provider. Document Released: 08/03/2007 Document Revised: 10/18/2015 Document Reviewed: 05/22/2013 Elsevier Interactive Patient Education  2017 [employer]  

## 2016-07-19 NOTE — Progress Notes (Signed)
Patient ID: Tyler [name], male   DOB: 1985/10/28, 31 y.o.   MRN: [id number]     Subjective:  I acted as a Education administrator for Dr. Carollee Herter.  [name], [place]   Patient ID: Tyler [name], male    DOB: Apr 05, 1985, 31 y.o.   MRN: [id number]  Chief Complaint  Patient presents with  . Sore Throat    yesterday morning  . Cough  . Fever    use dayquil/nyquil    Sore Throat   This is a new problem. The current episode started yesterday. Associated symptoms include congestion and coughing. Pertinent negatives include no headaches, shortness of breath or vomiting. Treatments tried: nyquil and dayquil. The treatment provided mild relief.  Cough  This is a new problem. The current episode started yesterday. The cough is productive of sputum. Associated symptoms include a fever and a sore throat. Pertinent negatives include no chest pain, headaches, rash or shortness of breath. Treatments tried: nyquil and dayquil.  Fever   This is a new problem. His temperature was unmeasured prior to arrival. Associated symptoms include congestion, coughing and a sore throat. Pertinent negatives include no chest pain, headaches, rash or vomiting.    Patient is in today for sore throat, fever, and cough.  Patient Care Team: Carollee Herter, Alferd Apa, DO as PCP - General (Family Medicine)   Past Medical History:  Diagnosis Date  . History of hemorrhoids    history of since child  . Other specified disorders of liver   . Tonsillitis   . Urinary dysfunction    per [facility]    Past Surgical History:  Procedure Laterality Date  . NO PAST SURGERIES      Family History  Problem Relation Age of Onset  . Hypertension Mother   . Kidney disease Paternal Uncle     Social History   Social History  . Marital status: Married    Spouse name: N/A  . Number of children: 0  . Years of education: N/A   Occupational History  . Health and safety inspector    Social History Main Topics  . Smoking status: Current Every  Day Smoker    Packs/day: 0.50    Years: 10.00    Types: Cigarettes  . Smokeless tobacco: Never Used  . Alcohol use No     Comment: rarely   . Drug use: No  . Sexual activity: Not Currently    Partners: Female   Other Topics Concern  . 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   No facility-administered medications prior to visit.     Allergies  Allergen Reactions  . Bactrim [Sulfamethoxazole-Trimethoprim] Other (See Comments)    Skin reactions.     Review of Systems  Constitutional: Positive for fever. Negative for malaise/fatigue.  HENT: Positive for congestion and sore throat.   Eyes: Negative for blurred vision.  Respiratory: Positive for cough. Negative for shortness of breath.   Cardiovascular: Negative for chest pain, palpitations and leg swelling.  Gastrointestinal: Negative for vomiting.  Musculoskeletal: Negative for back pain.  Skin: Negative for rash.  Neurological: Negative for loss of consciousness and headaches.       Objective:    Physical Exam  Constitutional: He appears well-developed and well-nourished. No distress.  HENT:  Head: Normocephalic and atraumatic.  Right Ear: Hearing, tympanic membrane, external ear and ear canal normal.  Left Ear: Hearing, tympanic membrane, external ear and ear canal normal.  Nose: Rhinorrhea present. Right sinus exhibits no maxillary sinus tenderness and no frontal sinus tenderness. Left sinus exhibits no maxillary sinus tenderness and no frontal sinus tenderness.  Mouth/Throat: Posterior oropharyngeal erythema present. No oropharyngeal exudate or posterior oropharyngeal edema.  Eyes: Conjunctivae are normal.  Neck: Normal range of motion. No thyromegaly present.  Cardiovascular: Normal rate and regular rhythm.   Pulmonary/Chest: Effort normal. He has  no wheezes.  Abdominal: Soft. Bowel sounds are normal. There is no tenderness.  Musculoskeletal: Normal range of motion. He exhibits no edema or deformity.  Lymphadenopathy:    He has cervical adenopathy.       Right cervical: Superficial cervical adenopathy present.       Left cervical: Superficial cervical adenopathy present.  Neurological: He is alert.  Skin: Skin is warm and dry. He is not diaphoretic.  Psychiatric: He has a normal mood and affect.  Nursing note and vitals reviewed.   BP 106/70 (BP Location: Left Arm, Cuff Size: Normal)   Pulse 83   Temp 98.4 F (36.9 C) (Oral)   Resp 16   Ht 5\' 5"  (1.651 m)   Wt 144 lb (65.3 kg)   SpO2 98%   BMI 23.96 kg/m  Wt Readings from Last 3 Encounters:  07/19/16 144 lb (65.3 kg)  06/06/16 144 lb (65.3 kg)  01/12/16 142 lb (64.4 kg)   BP Readings from Last 3 Encounters:  07/19/16 106/70  06/06/16 100/68  01/12/16 (!) 110/58     Immunization History  Administered Date(s) Administered  . Influenza,inj,Quad PF,36+ Mos 01/12/2016  . Tdap 06/26/2015    Health Maintenance  Topic Date Due  . INFLUENZA VACCINE  09/28/2016  . TETANUS/TDAP  06/25/2025  . HIV Screening  Completed    Lab Results  Component Value Date   WBC 6.1 06/26/2015   HGB 15.2 06/26/2015   HCT 44.2 06/26/2015   PLT 209 06/26/2015   GLUCOSE 149 (H) 06/26/2015   CHOL 193 06/26/2015   TRIG 338 (H) 06/26/2015   HDL 47 06/26/2015   LDLCALC 78 06/26/2015   ALT 39 06/26/2015   AST 23 06/26/2015   NA 138 06/26/2015   K 3.9 06/26/2015   CL 101 06/26/2015   CREATININE 1.01 06/26/2015   BUN 10 06/26/2015   CO2 28 06/26/2015   TSH 0.78 06/26/2015   PSA 1.38 02/11/2015   HGBA1C 5.6 06/26/2015    Lab Results  Component Value Date   TSH 0.78 06/26/2015   Lab Results  Component Value Date   WBC 6.1 06/26/2015   HGB 15.2 06/26/2015   HCT 44.2 06/26/2015   MCV 89.1 06/26/2015   PLT 209 06/26/2015   Lab Results  Component Value Date   NA 138  06/26/2015   K 3.9 06/26/2015   CO2 28 06/26/2015   GLUCOSE 149 (H) 06/26/2015   BUN 10 06/26/2015   CREATININE 1.01 06/26/2015   BILITOT 0.7 06/26/2015   ALKPHOS 52 06/26/2015   AST 23 06/26/2015   ALT 39 06/26/2015   PROT 6.8 06/26/2015   ALBUMIN 4.2 06/26/2015   CALCIUM 8.9 06/26/2015   GFR 76.49 05/05/2015   Lab Results  Component Value Date   CHOL 193 06/26/2015   Lab Results  Component Value Date   HDL 47 06/26/2015   Lab Results  Component Value Date  [place] 78 06/26/2015   Lab Results  Component Value Date   TRIG 338 (H) 06/26/2015   Lab Results  Component Value Date   CHOLHDL 4.1 06/26/2015   Lab Results  Component Value Date   HGBA1C 5.6 06/26/2015         Assessment & Plan:   Problem List Items Addressed This Visit    None    Visit Diagnoses    Sore throat    -  Primary   Relevant Medications   amoxicillin (AMOXIL) 875 MG tablet   Other Relevant Orders   POCT rapid strep A (Completed)   Culture, Group A Strep   Upper respiratory tract infection, unspecified type       Relevant Medications   levocetirizine (XYZAL) 5 MG tablet   fluticasone (FLONASE) 50 MCG/ACT nasal spray      I am having Mr. Rebello start on amoxicillin, levocetirizine, and fluticasone.  Meds ordered this encounter  Medications  . amoxicillin (AMOXIL) 875 MG tablet    Sig: Take 1 tablet (875 mg total) by mouth 2 (two) times daily.    Dispense:  20 tablet    Refill:  0  . levocetirizine (XYZAL) 5 MG tablet    Sig: Take 1 tablet (5 mg total) by mouth every evening.    Dispense:  30 tablet    Refill:  5  . fluticasone (FLONASE) 50 MCG/ACT nasal spray    Sig: Place 2 sprays into both nostrils daily.    Dispense:  16 g    Refill:  6    CMA served as scribe during this visit. History, Physical and Plan performed by medical provider. Documentation and orders reviewed and attested to.  Ann Held, DO

## 2016-07-21 ENCOUNTER — Telehealth: Payer: Self-pay | Admitting: Family Medicine

## 2016-07-21 ENCOUNTER — Encounter: Payer: Self-pay | Admitting: Family Medicine

## 2016-07-21 LAB — CULTURE, GROUP A STREP: ORGANISM ID, BACTERIA: NORMAL

## 2016-07-21 NOTE — Telephone Encounter (Signed)
Ok to give note 

## 2016-07-21 NOTE — Telephone Encounter (Signed)
Relation to ZF:POIP Call back number:[phone]   Reason for call:  Patient was seen by PCP 07/19/16 requesting Dr. Note excusing him from work for Tuesday 07/19/16, Wednesday 07/20/16 and Thursday 07/21/16 and would like to print from My Chart

## 2016-07-21 NOTE — Telephone Encounter (Signed)
Note done/sent through mychart/patient notified.

## 2016-08-04 IMAGING — MR MR LUMBAR SPINE WO/W CM
4 of 7 series · 19 of 48 positions shown · IV contrast (Yes)
Comparison: MRI brain and MRI of the cervical spine from the same
day.

CLINICAL DATA: Detrusor/sphincter dyssynergia. Question multiple
sclerosis.

EXAM:
MRI LUMBAR SPINE WITHOUT AND WITH CONTRAST
TECHNIQUE: Multiplanar and multiecho pulse sequences of the lumbar spine were
obtained without and with intravenous contrast.
CONTRAST:  12mL MULTIHANCE GADOBENATE DIMEGLUMINE 529 MG/ML IV SOLN

[Series 24: T1 · sagittal · 4.0mm · 0.55mm/px · 4 of 12 slices shown (1 of 2)]
[im 1/12]
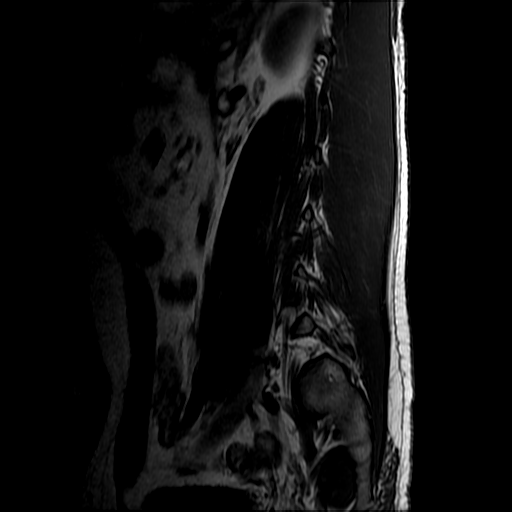
[im 4/12]
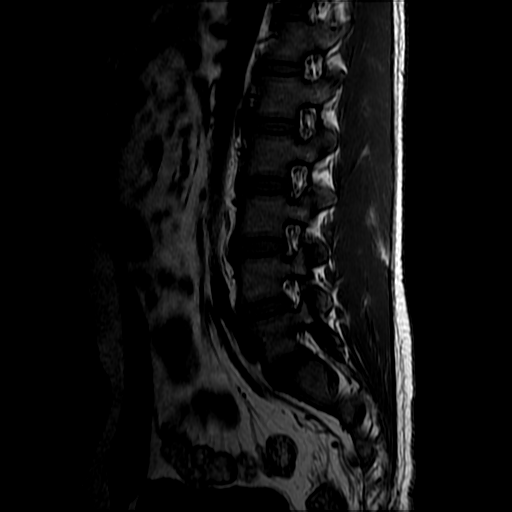
[im 8/12]
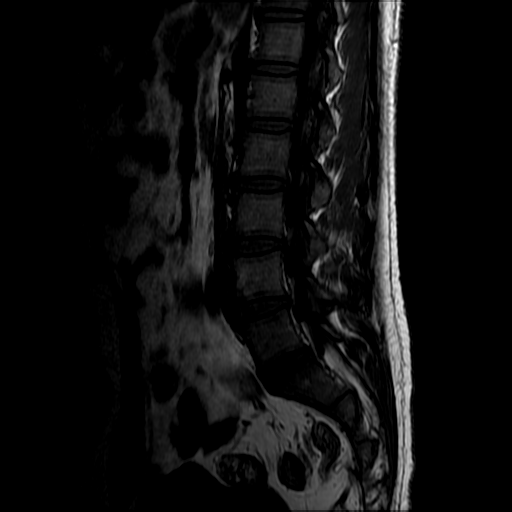
[im 12/12]
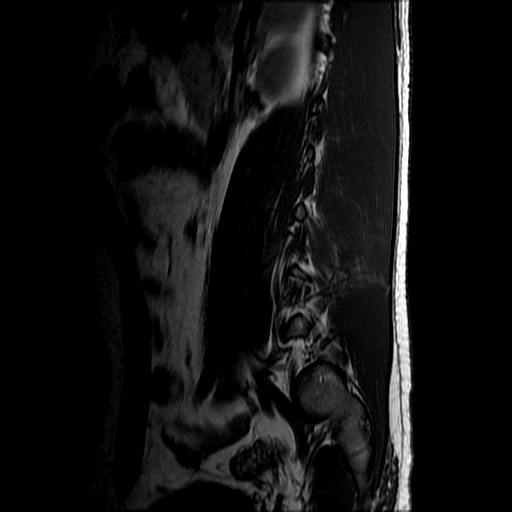

[Series 26: T2 · axial · 4.0mm · 0.39mm/px · z∈[-606,-394]mm · 9 of 40 slices shown]
[im 1/40]
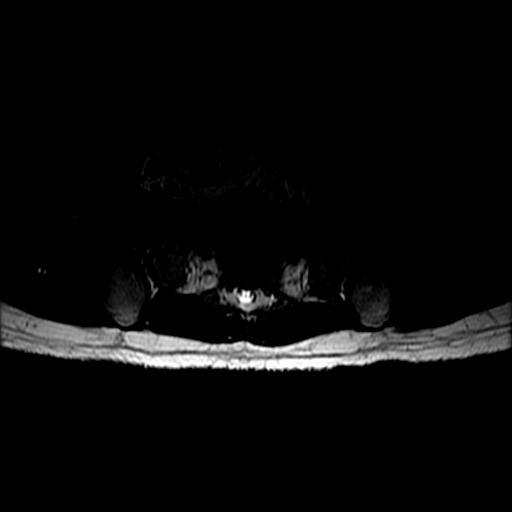
[im 8/40]
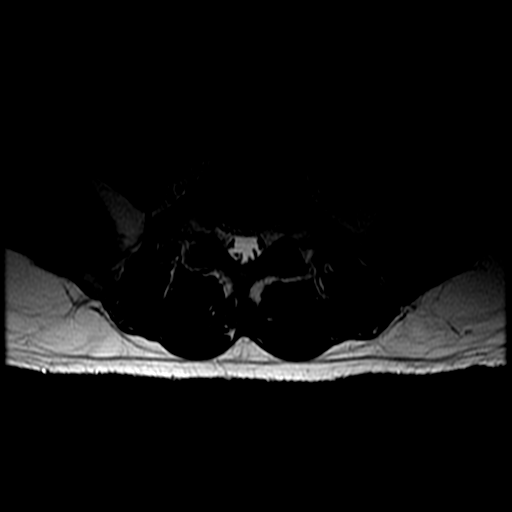
[im 11/40]
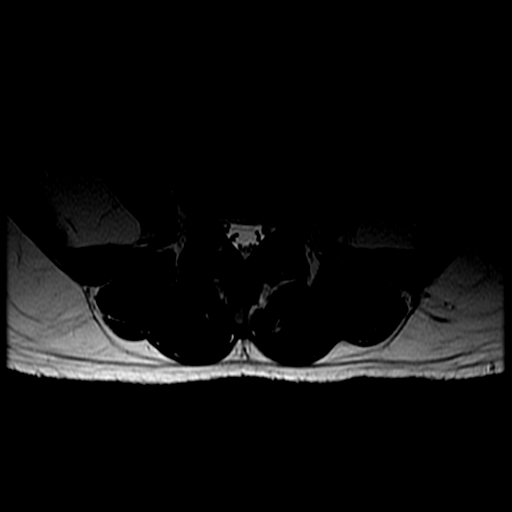
[im 18/40]
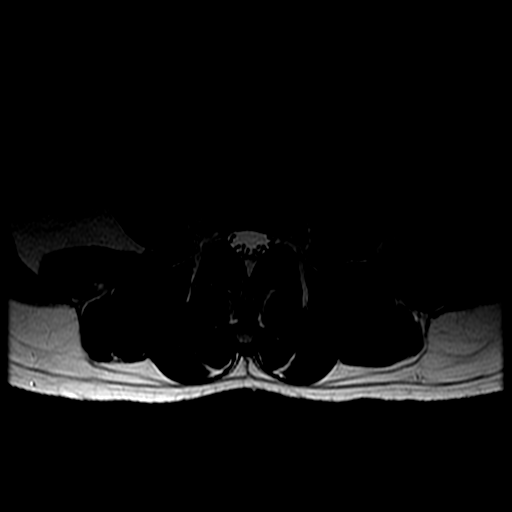
[im 22/40]
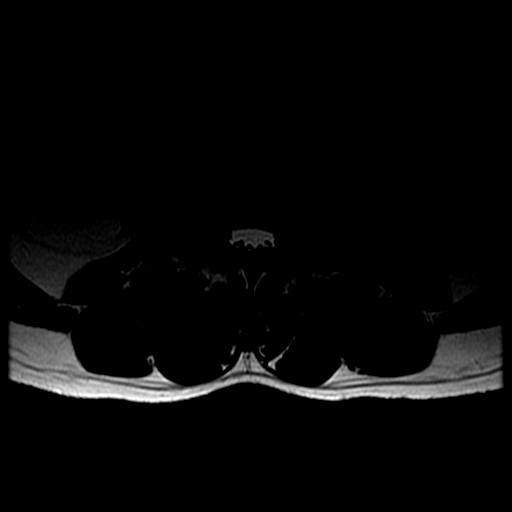
[im 29/40]
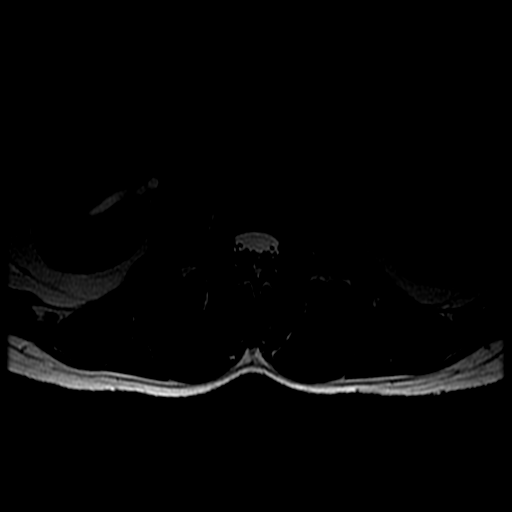
[im 32/40]
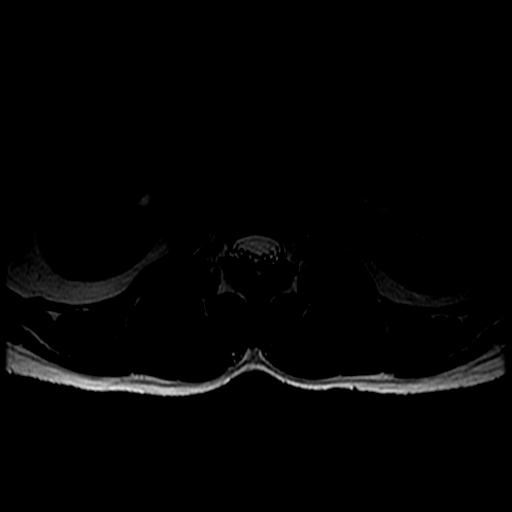
[im 36/40]
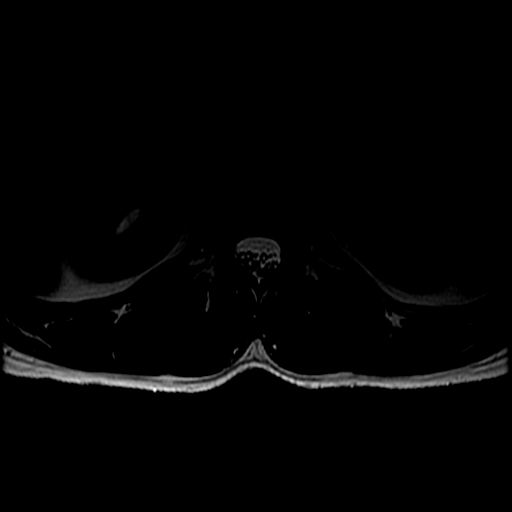
[im 40/40]
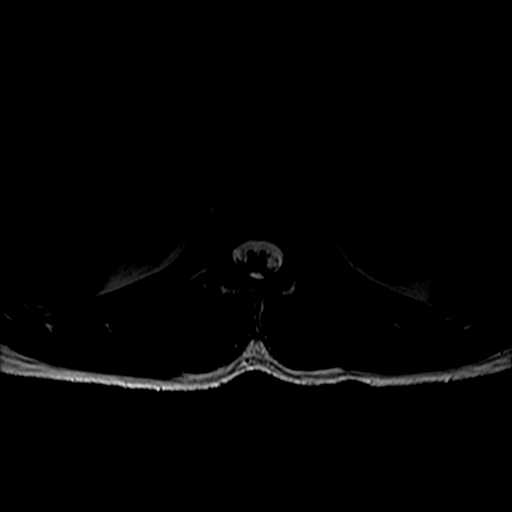

[Series 27: T1 · axial · 4.0mm · 0.39mm/px · z∈[-591,-414]mm · 3 of 40 slices shown (2 of 2)]
[im 4/40]
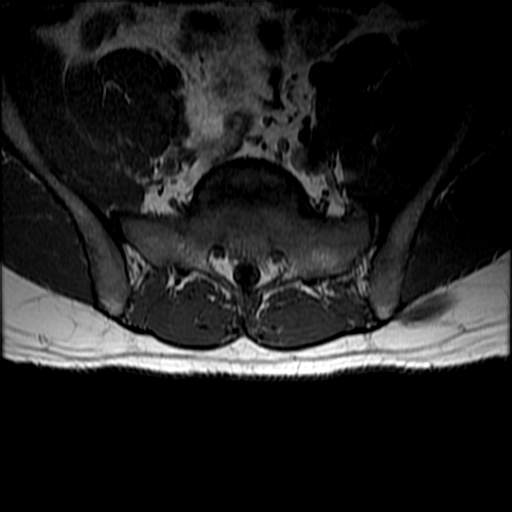
[im 20/40]
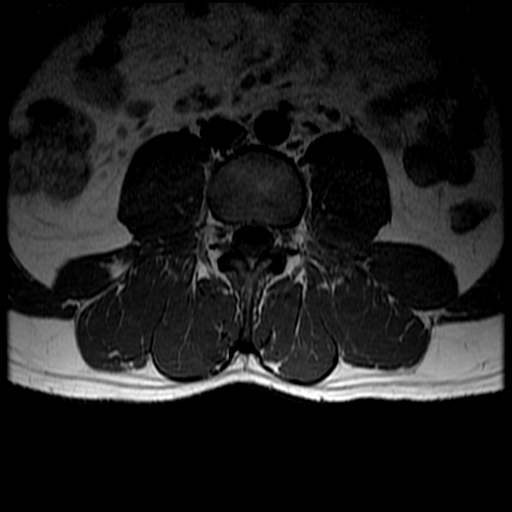
[im 36/40]
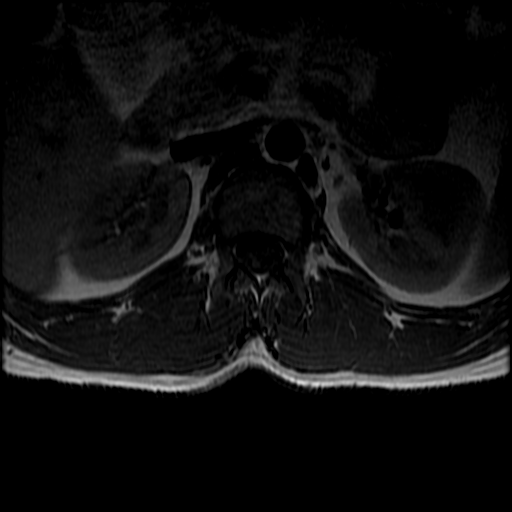

[Series 28: T2 post-contrast · sagittal · 4.0mm · 0.55mm/px · 3 of 12 slices shown]
[im 1/12]
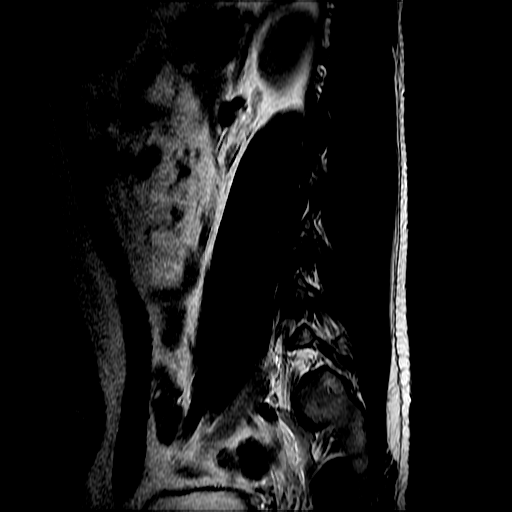
[im 6/12]
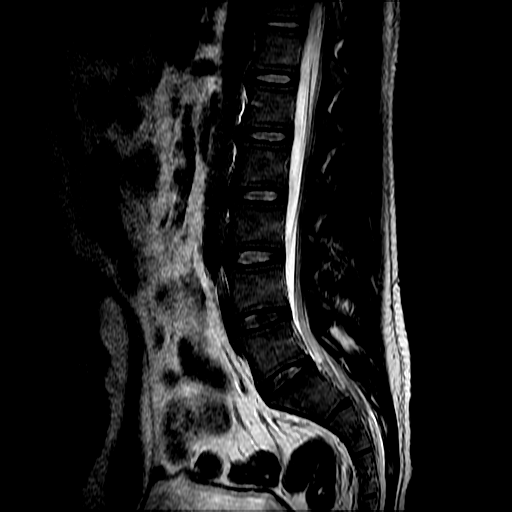
[im 12/12]
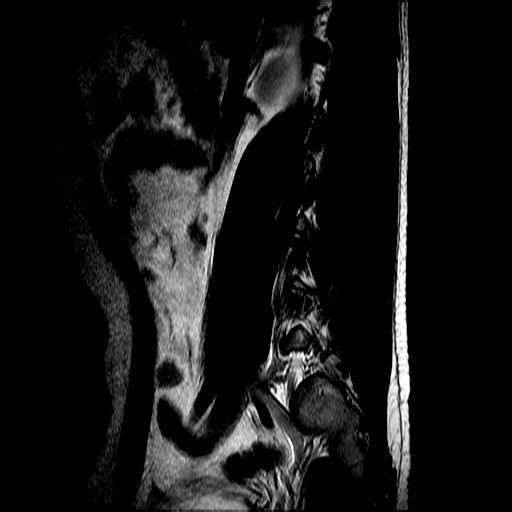

[19 of 48 positions shown; findings below may reference images not displayed]

FINDINGS: Normal signal is present in the conus medullaris which terminates at
L1. Marrow signal, vertebral body heights, and alignment are normal.

Limited imaging of the abdomen is unremarkable. There is mild
distention of the urinary bladder. Bladder wall thickening is
suggested as well.

No focal disc protrusion or stenosis is present in the lumbar spine.
The foramina are patent bilaterally. No significant facet disease is
evident.

The postcontrast images demonstrate no pathologic enhancement.
IMPRESSION: 1. Normal MRI appearance of the lumbar spine without and with
contrast.
2. Mild distension of the urinary bladder. Mild wall thickening is
evident.

## 2016-08-04 IMAGING — MR MR HEAD WO/W CM
10 of 13 series · 35 of 48 positions shown · IV contrast (multihance)
Comparison: Cervical and lumbar MRI of the same day.

CLINICAL DATA: Detrusor/sphincter dyssynergia. Question multiple
sclerosis.

EXAM:
MRI HEAD WITHOUT AND WITH CONTRAST
TECHNIQUE: Multiplanar, multiecho pulse sequences of the brain and surrounding
structures were obtained without and with intravenous contrast.
CONTRAST:  12 mL MultiHance

[Series 3: T1 · sagittal · 5.0mm · 0.47mm/px · 2 of 24 slices shown]
[im 1/24]
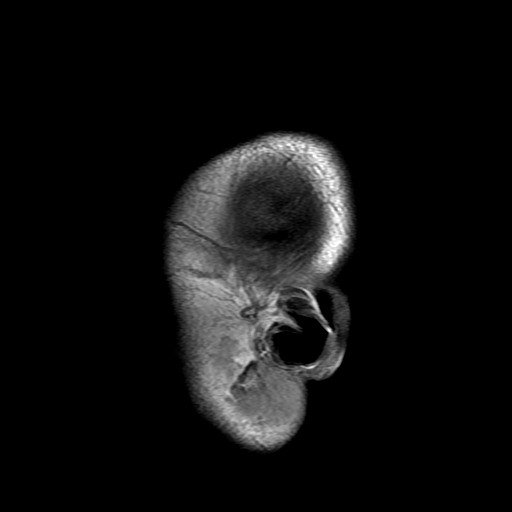
[im 12/24]
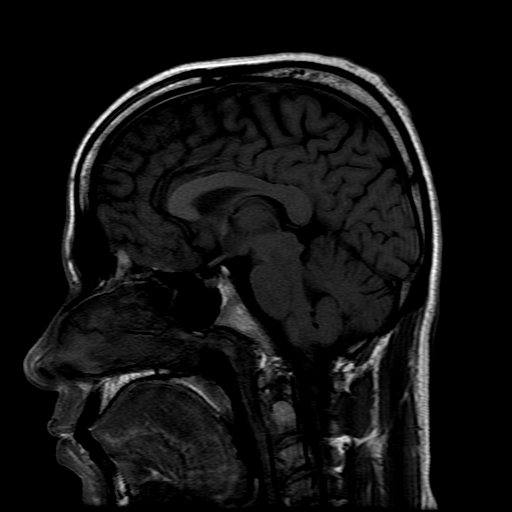

[Series 4: DWI · axial · 3.0mm · 1.09mm/px · z∈[-39,+106]mm · 9 of 100 slices shown (1 of 4)]
[im 1/100]
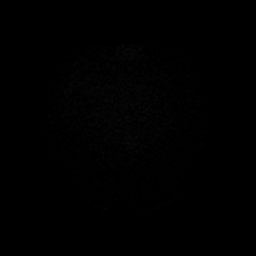
[im 13/100]
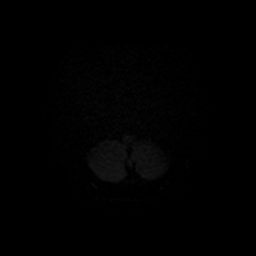
[im 25/100]
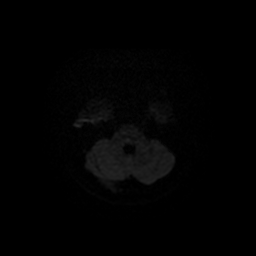
[im 38/100]
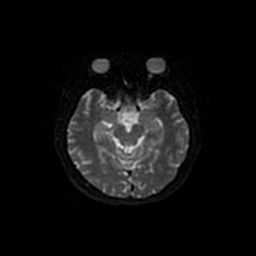
[im 50/100]
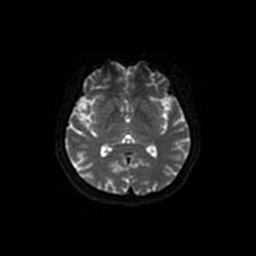
[im 62/100]
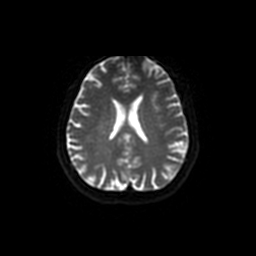
[im 75/100]
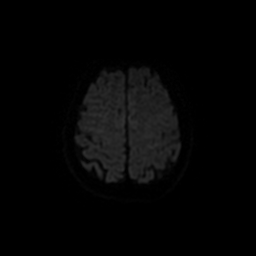
[im 87/100]
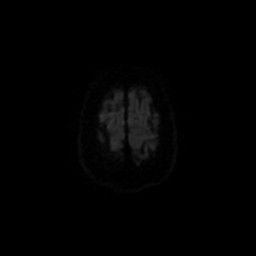
[im 100/100]
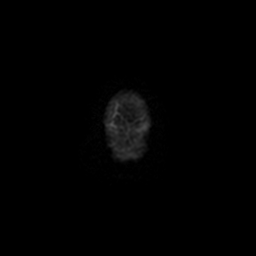

[Series 5: DWI · coronal · 5.0mm · 1.09mm/px · 6 of 68 slices shown (2 of 4)]
[im 1/68]
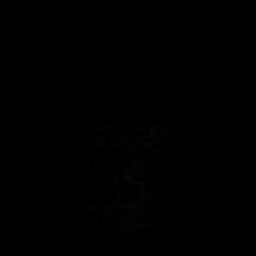
[im 14/68]
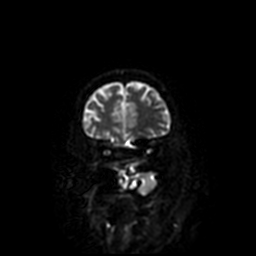
[im 27/68]
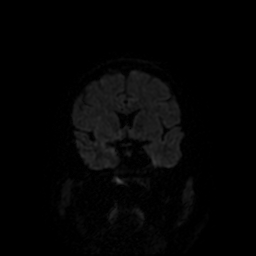
[im 41/68]
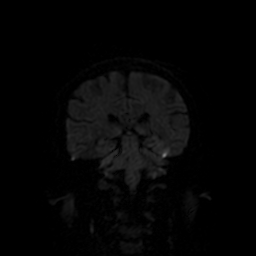
[im 54/68]
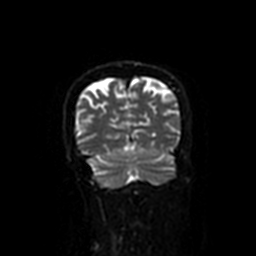
[im 68/68]
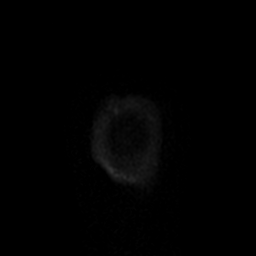

[Series 6: T2 · axial · 5.0mm · 0.43mm/px · z∈[-37,+112]mm · 2 of 24 slices shown]
[im 1/24]
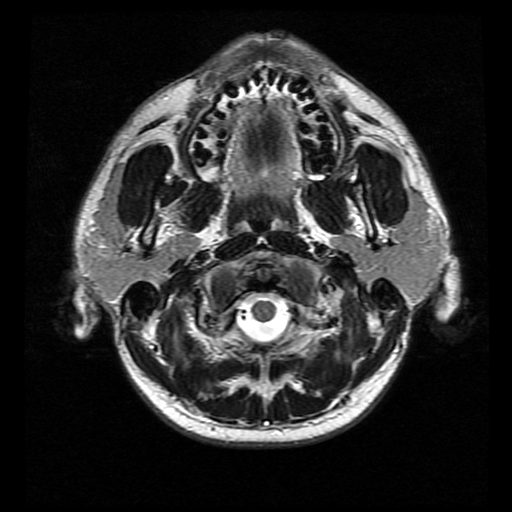
[im 24/24]
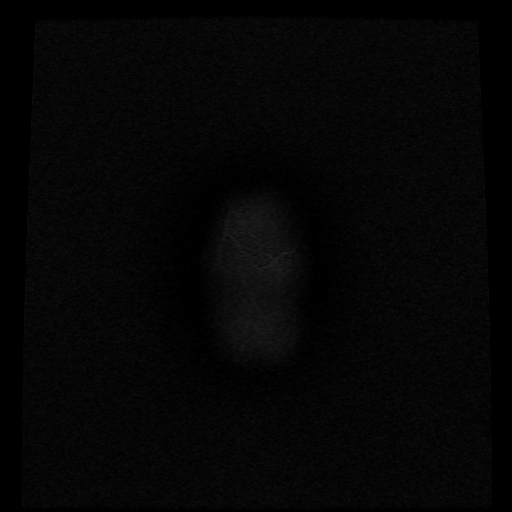

[Series 7: FLAIR · axial · 5.0mm · 0.43mm/px · z∈[-42,+118]mm · 2 of 24 slices shown]
[im 1/24]
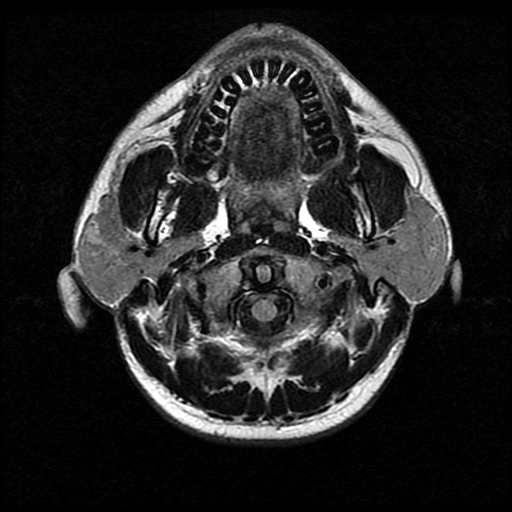
[im 24/24]
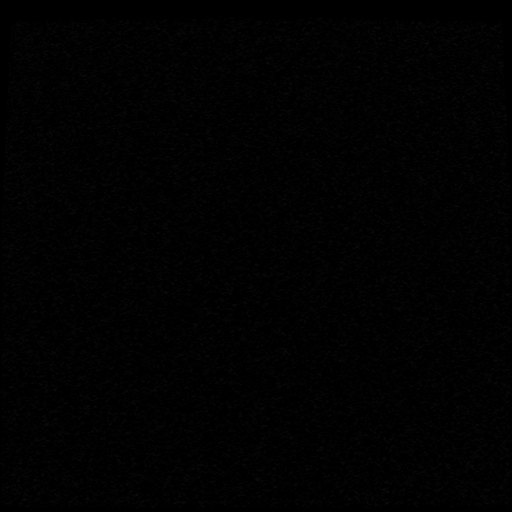

[Series 33: T2 post-contrast · coronal · 5.0mm · 0.45mm/px · 2 of 27 slices shown]
[im 1/27]
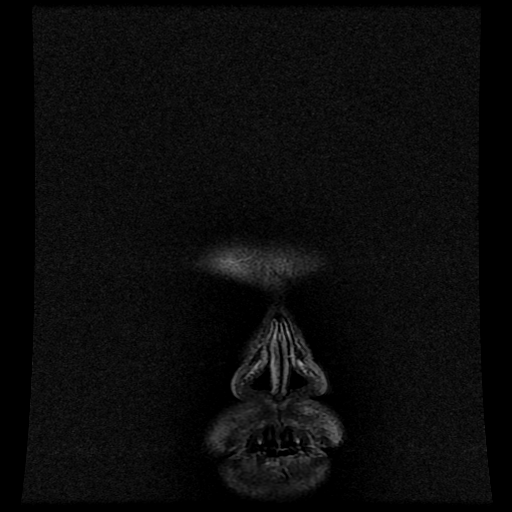
[im 27/27]
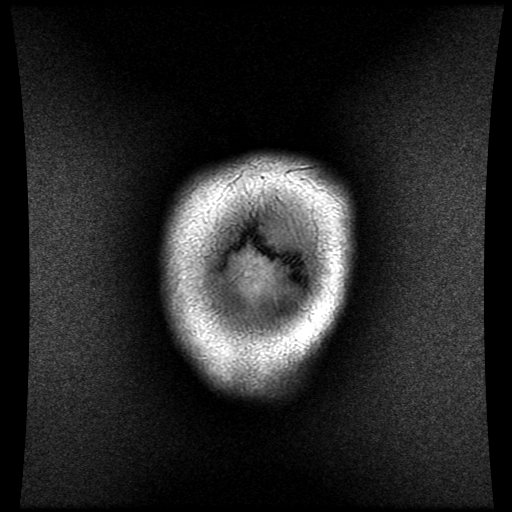

[Series 35: T1 post-contrast · coronal · 5.0mm · 0.45mm/px · 2 of 27 slices shown (1 of 2)]
[im 1/27]
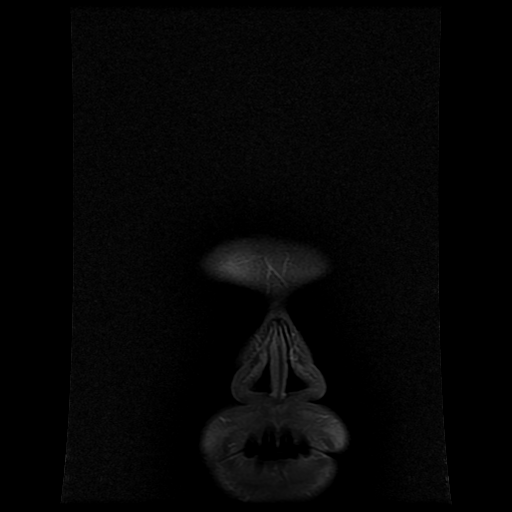
[im 27/27]
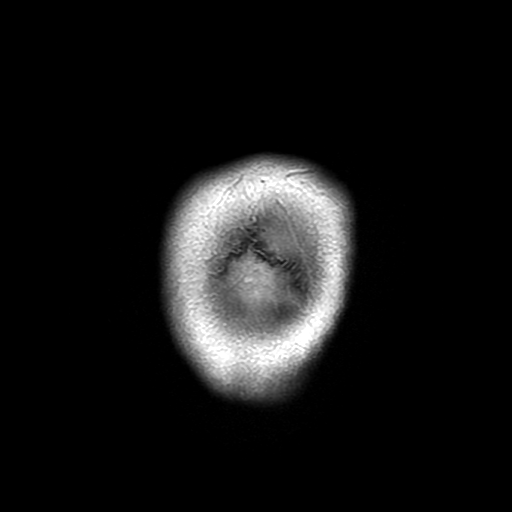

[Series 36: T1 post-contrast · sagittal · 5.0mm · 0.47mm/px · 2 of 24 slices shown (2 of 2)]
[im 1/24]
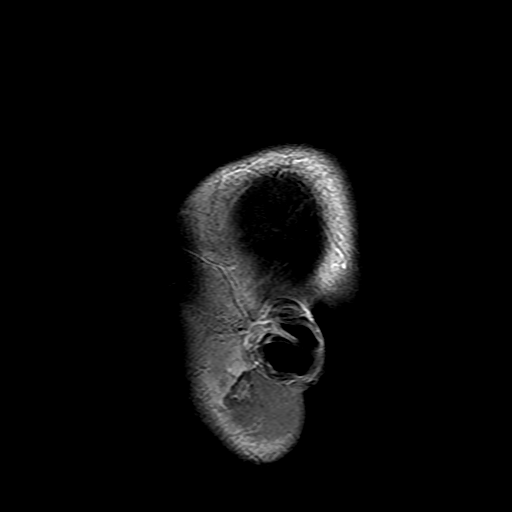
[im 24/24]
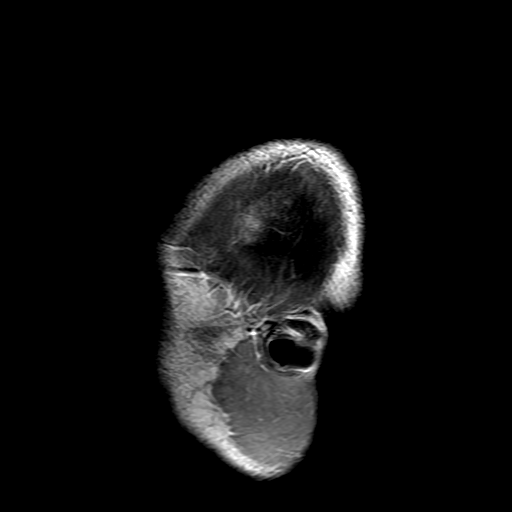

[Series 400: DWI · axial · 3.0mm · 1.09mm/px · z∈[-39,+106]mm · 5 of 50 slices shown (3 of 4)]
[im 1/50]
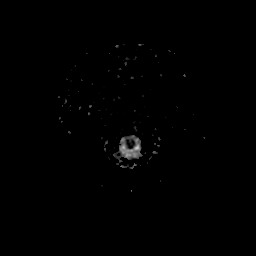
[im 13/50]
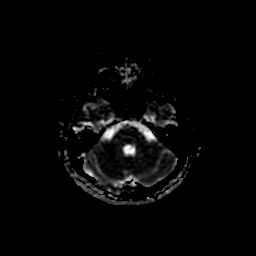
[im 25/50]
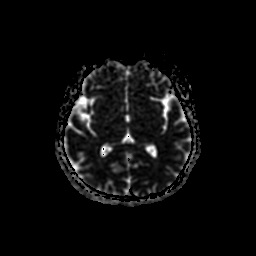
[im 37/50]
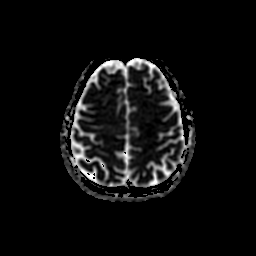
[im 50/50]
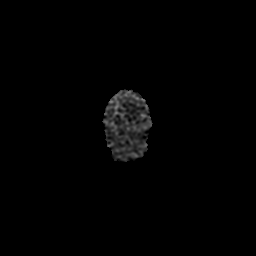

[Series 500: DWI · coronal · 5.0mm · 1.09mm/px · 3 of 34 slices shown (4 of 4)]
[im 1/34]
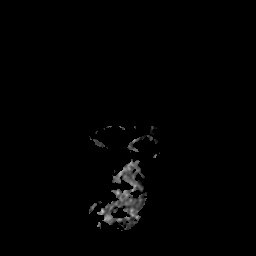
[im 17/34]
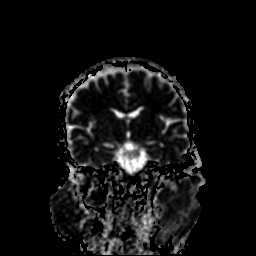
[im 34/34]
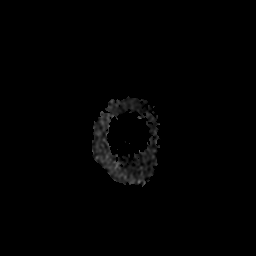

[35 of 48 positions shown; findings below may reference images not displayed]

FINDINGS: No acute infarct, hemorrhage, or mass lesion is present. The
ventricles are of normal size. No significant extraaxial fluid
collection is present.

No significant white matter disease is present. The ventricles are
of normal size.

Internal auditory canals are within normal limits. The brainstem and
cerebellum are normal. Flow is present in the major intracranial
arteries. The globes and orbits are intact. A polyp or mucous
retention cyst in the left maxillary sinus measures 2.4 cm
maximally. The paranasal sinuses and mastoid air cells are otherwise
clear.

The skullbase is within normal limits. Midline sagittal images are
unremarkable.

The postcontrast images demonstrate no pathologic enhancement.
IMPRESSION: 1. Normal MRI appearance of the brain. No significant white matter
disease.
2. 2.4 cm polyp or mucous retention cyst within the left maxillary
sinus.

## 2016-08-04 IMAGING — MR MR CERVICAL SPINE WO/W CM
4 of 7 series · 19 of 48 positions shown · IV contrast (multihance)
Comparison: Brain MRI and lumbar spine MRI this same day reviewed.

CLINICAL DATA: Question multiple sclerosis. Detrusor/sphincter
dyssynergia. Initial encounter.

EXAM:
MRI CERVICAL SPINE WITHOUT AND WITH CONTRAST
TECHNIQUE: Multiplanar and multiecho pulse sequences of the cervical spine, to
include the craniocervical junction and cervicothoracic junction,
were obtained according to standard protocol without and with
intravenous contrast.
CONTRAST:  12 cc MultiHance IV.

[Series 15: T2 post-contrast · sagittal · 3.0mm · 0.41mm/px · 5 of 12 slices shown]
[im 1/12]
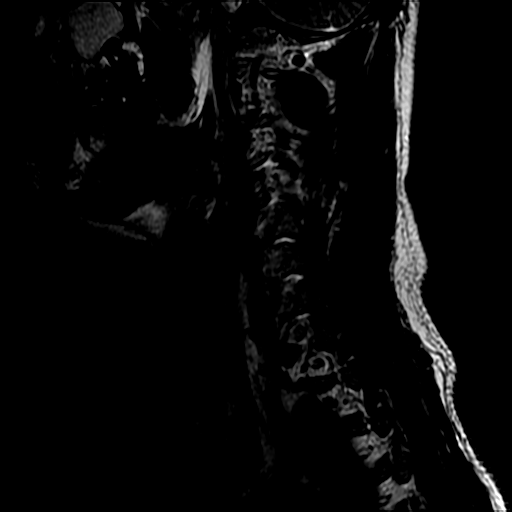
[im 3/12]
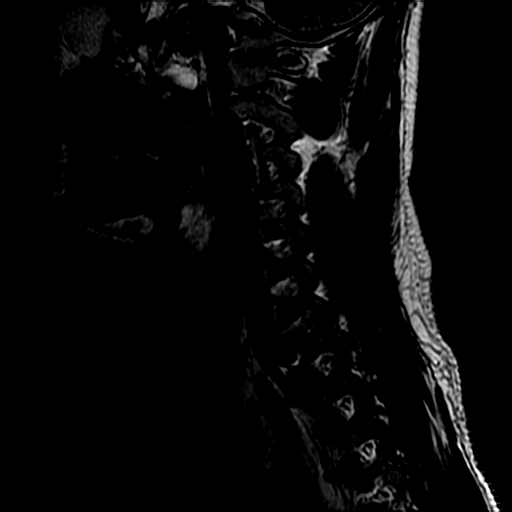
[im 6/12]
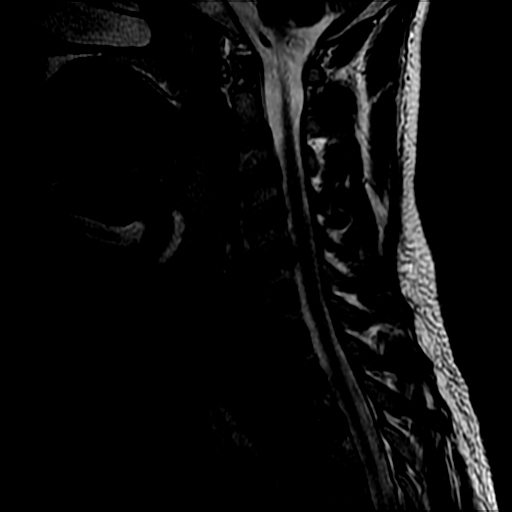
[im 9/12]
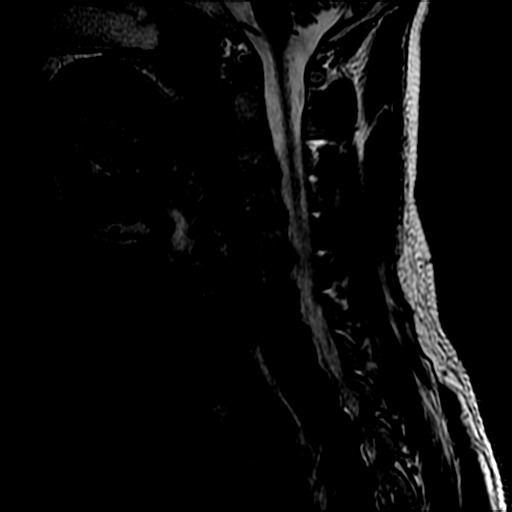
[im 12/12]
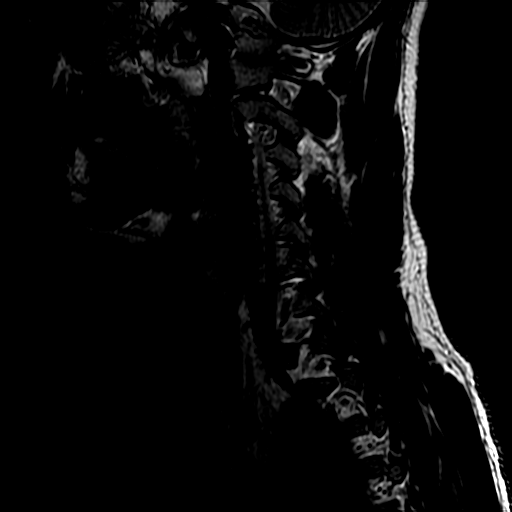

[Series 18: T1 · sagittal · 3.0mm · 0.41mm/px · 3 of 12 slices shown (1 of 2)]
[im 1/12]
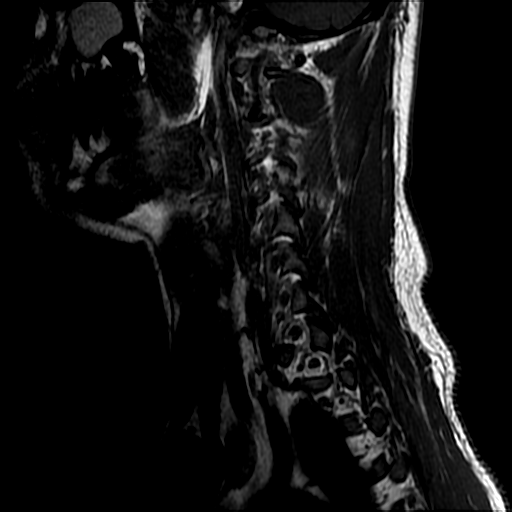
[im 8/12]
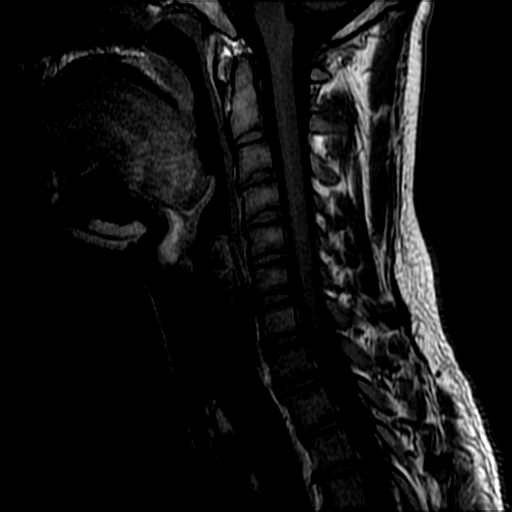
[im 12/12]
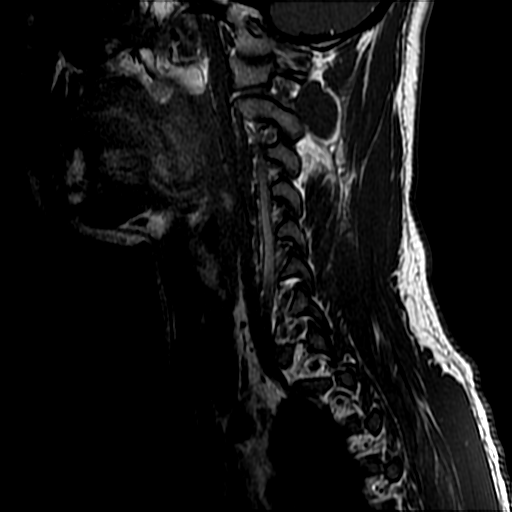

[Series 19: T2 · axial · 3.0mm · 0.35mm/px · z∈[-162,-68]mm · 8 of 29 slices shown]
[im 1/29]
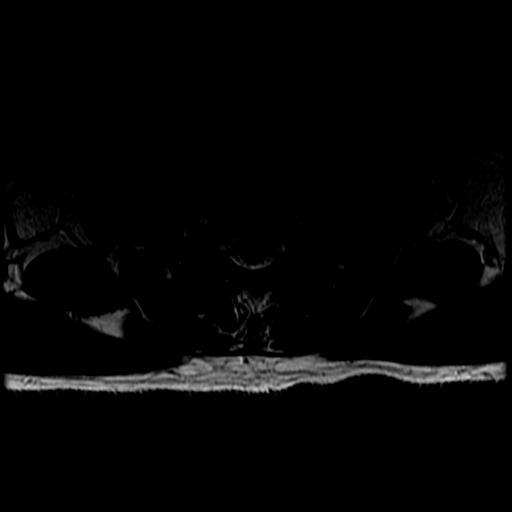
[im 4/29]
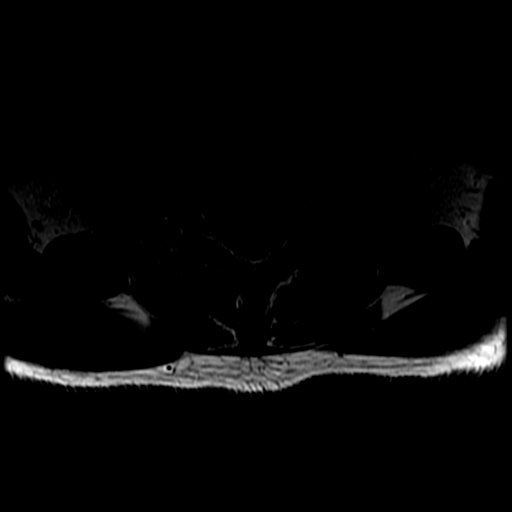
[im 10/29]
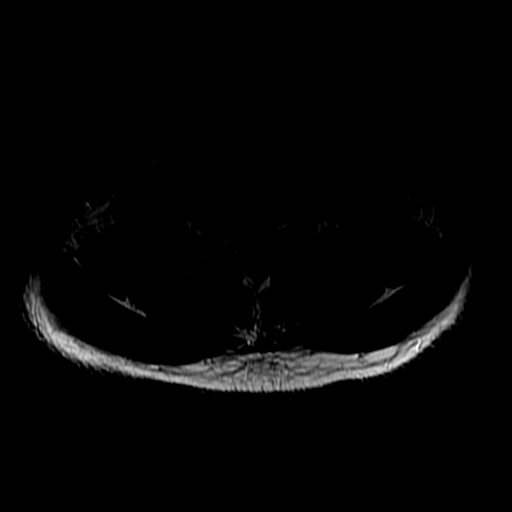
[im 13/29]
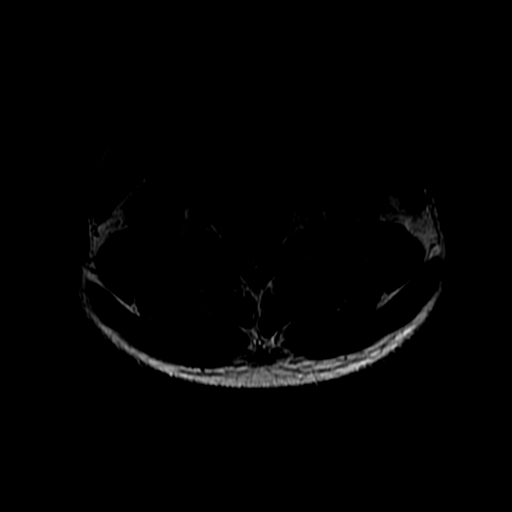
[im 16/29]
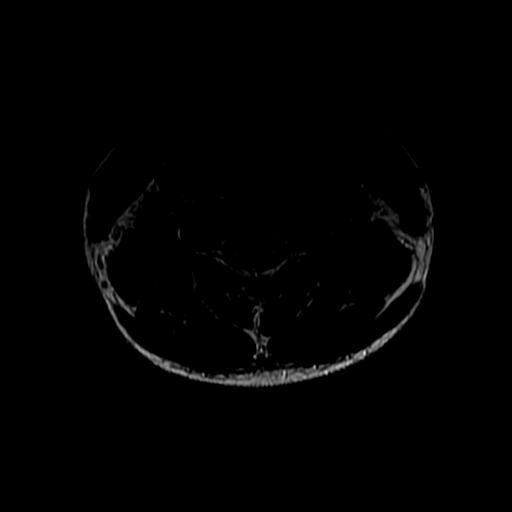
[im 19/29]
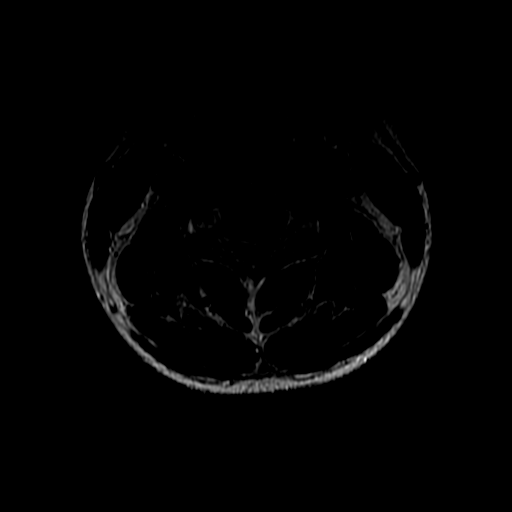
[im 25/29]
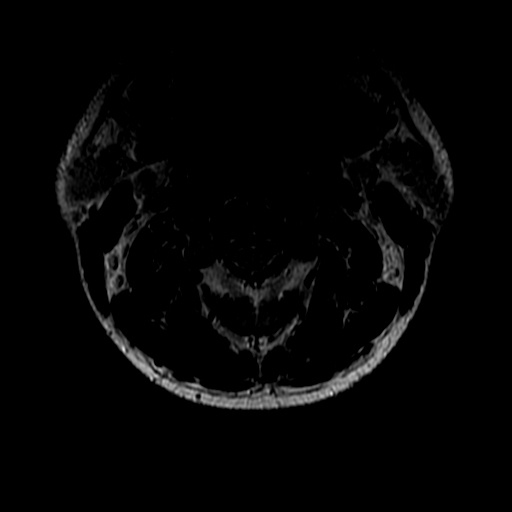
[im 29/29]
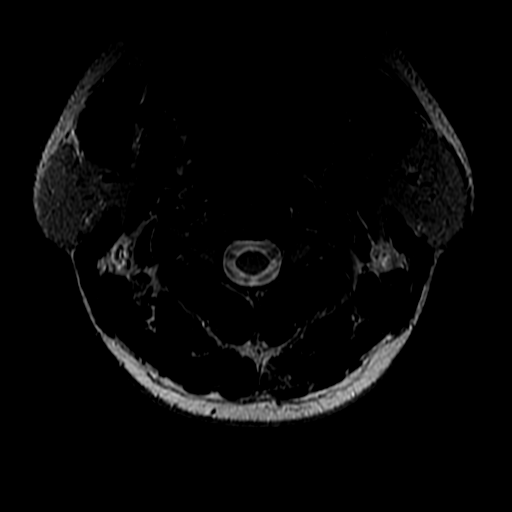

[Series 20: T1 · axial · non-contrast · 3.0mm · 0.35mm/px · z∈[-152,-81]mm · 3 of 29 slices shown (2 of 2)]
[im 4/29]
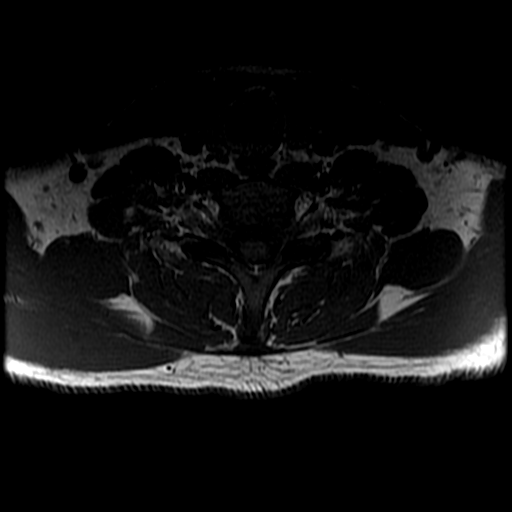
[im 16/29]
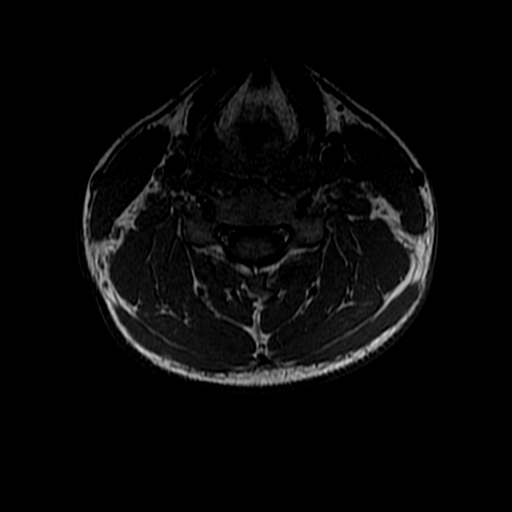
[im 25/29]
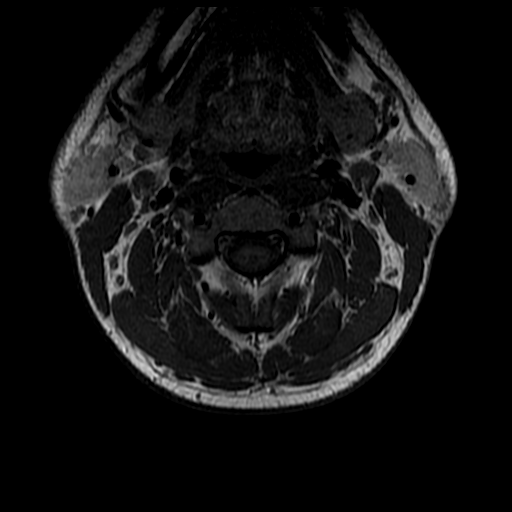

[19 of 48 positions shown; findings below may reference images not displayed]

FINDINGS: Vertebral body height, signal and alignment are normal. The
craniocervical junction is normal. The cervical cord demonstrates
normal signal. There is no pathologic enhancement after contrast
administration. Imaged paraspinous structures are unremarkable.

C2-3:  Negative.

C3-4: Minimal disc bulge and uncovertebral disease without central
canal or foraminal stenosis.

C4-5: Shallow disc bulge nearly effaces the ventral thecal sac. The
foramina are widely patent.

C5-6: The patient has a small left paracentral protrusion which
effaces the ventral thecal sac. The foramina are widely patent.

C6-7: Negative.

C7-T1:  Negative.
IMPRESSION: Negative for demyelinating disease. The cervical cord appears
normal.

Shallow disc bulge at C4-5 and shallow central protrusion C5-6
efface the ventral thecal sac but the central canal and foramina
appear open at both levels.

## 2016-09-24 ENCOUNTER — Encounter: Payer: Self-pay | Admitting: Family Medicine

## 2016-09-26 DIAGNOSIS — H60332 Swimmer's ear, left ear: Secondary | ICD-10-CM | POA: Insufficient documentation

## 2016-09-26 DIAGNOSIS — J343 Hypertrophy of nasal turbinates: Secondary | ICD-10-CM | POA: Insufficient documentation

## 2016-09-26 NOTE — Telephone Encounter (Signed)
He needs ov later in week

## 2016-11-01 ENCOUNTER — Ambulatory Visit (INDEPENDENT_AMBULATORY_CARE_PROVIDER_SITE_OTHER): Payer: BLUE CROSS/BLUE SHIELD | Admitting: Family Medicine

## 2016-11-01 ENCOUNTER — Encounter: Payer: Self-pay | Admitting: Family Medicine

## 2016-11-01 VITALS — BP 102/72 | HR 76 | Temp 98.6°F | Ht 65.0 in | Wt 149.0 lb

## 2016-11-01 DIAGNOSIS — Z23 Encounter for immunization: Secondary | ICD-10-CM | POA: Diagnosis not present

## 2016-11-01 DIAGNOSIS — R079 Chest pain, unspecified: Secondary | ICD-10-CM

## 2016-11-01 DIAGNOSIS — R935 Abnormal findings on diagnostic imaging of other abdominal regions, including retroperitoneum: Secondary | ICD-10-CM | POA: Insufficient documentation

## 2016-11-01 LAB — COMPREHENSIVE METABOLIC PANEL
ALT: 33 U/L (ref 0–53)
AST: 27 U/L (ref 0–37)
Albumin: 4.5 g/dL (ref 3.5–5.2)
Alkaline Phosphatase: 59 U/L (ref 39–117)
BUN: 8 mg/dL (ref 6–23)
CALCIUM: 9.7 mg/dL (ref 8.4–10.5)
CHLORIDE: 102 meq/L (ref 96–112)
CO2: 29 meq/L (ref 19–32)
Creatinine, Ser: 1.04 mg/dL (ref 0.40–1.50)
GFR: 88.47 mL/min (ref >60.00)
Glucose, Bld: 117 mg/dL — ABNORMAL HIGH (ref 70–99)
POTASSIUM: 3.8 meq/L (ref 3.5–5.1)
Sodium: 138 mEq/L (ref 135–145)
Total Bilirubin: 0.7 mg/dL (ref 0.2–1.2)
Total Protein: 7 g/dL (ref 6.0–8.3)

## 2016-11-01 LAB — CBC WITH DIFFERENTIAL/PLATELET
BASOS PCT: 0.5 % (ref 0.0–3.0)
Basophils Absolute: 0 10*3/uL (ref 0.0–0.1)
EOS ABS: 0.3 10*3/uL (ref 0.0–0.7)
Eosinophils Relative: 3.3 % (ref 0.0–5.0)
HEMATOCRIT: 46.1 % (ref 39.0–52.0)
Hemoglobin: 16 g/dL (ref 13.0–17.0)
LYMPHS PCT: 21.7 % (ref 12.0–46.0)
Lymphs Abs: 1.9 10*3/uL (ref 0.7–4.0)
MCHC: 34.6 g/dL (ref 30.0–36.0)
MCV: 89.4 fl (ref 78.0–100.0)
Monocytes Absolute: 0.6 10*3/uL (ref 0.1–1.0)
Monocytes Relative: 6.8 % (ref 3.0–12.0)
NEUTROS ABS: 5.9 10*3/uL (ref 1.4–7.7)
Neutrophils Relative %: 67.7 % (ref 43.0–77.0)
PLATELETS: 221 10*3/uL (ref 150.0–400.0)
RBC: 5.16 Mil/uL (ref 4.22–5.81)
RDW: 12.6 % (ref 11.5–15.5)
WBC: 8.7 10*3/uL (ref 4.0–10.5)

## 2016-11-01 LAB — TSH: TSH: 0.94 u[IU]/mL (ref 0.35–4.50)

## 2016-11-01 NOTE — Assessment & Plan Note (Signed)
Pt states this is not the same pain he has had and he would like this checked again cxr  Did discuss possibilty of stress but pt denies that this could be a possibility

## 2016-11-01 NOTE — Progress Notes (Signed)
Patient ID: Tyler [name], male    DOB: [birth year]  Age: 31 y.o. MRN: [id number]    Subjective:  Subjective  HPI Tyler [name] presents for r sided chest pain that is new per pat.  It started about 2 months ago and is in upper r chest.  No cough.   It lasts 1 hour.   Stress level is the same.  Nothing new.  No sob, no palpitations.  Pain is described as dull-- he feels like there is something moving, a tingling sensation and it feels like pressure sometimes,  Or tingling.  He feels like there is liquid moving in there.  It comes and goes and it occurs about 1x q 2 days.  He has not seen a pattern.   Not associated with eating.  No burping but has noticed stomach grumbling a little more.     Review of Systems  Constitutional: Negative for appetite change, diaphoresis, fatigue and unexpected weight change.  Eyes: Negative for pain, redness and visual disturbance.  Respiratory: Positive for chest tightness. Negative for cough, shortness of breath and wheezing.   Cardiovascular: Positive for chest pain. Negative for palpitations and leg swelling.  Gastrointestinal: Positive for abdominal pain. Negative for abdominal distention, anal bleeding, blood in stool, constipation, diarrhea and nausea.  Endocrine: Negative for cold intolerance, heat intolerance, polydipsia, polyphagia and polyuria.  Genitourinary: Negative for difficulty urinating, dysuria and frequency.  Neurological: Negative for dizziness, light-headedness, numbness and headaches.    History Past Medical History:  Diagnosis Date  . History of hemorrhoids    history of since child  . Other specified disorders of liver   . Tonsillitis   . Urinary dysfunction    per [facility]    He has a past surgical history that includes No past surgeries.   His family history includes Hypertension in his mother; Kidney disease in his paternal uncle.He reports that he has been smoking Cigarettes.  He has a 5.00 pack-year smoking  history. He has never used smokeless tobacco. He reports that he does not drink alcohol or use drugs.  Current Outpatient Prescriptions on File Prior to Visit  Medication Sig Dispense Refill  . fluticasone (FLONASE) 50 MCG/ACT nasal spray Place 2 sprays into both nostrils daily. (Patient not taking: Reported on 11/01/2016) 16 g 6  . levocetirizine (XYZAL) 5 MG tablet Take 1 tablet (5 mg total) by mouth every evening. (Patient not taking: Reported on 11/01/2016) 30 tablet 5   No current facility-administered medications on file prior to visit.      Objective:  Objective  Physical Exam  Constitutional: He is oriented to person, place, and time. Vital signs are normal. He appears well-developed and well-nourished. He is sleeping.  HENT:  Head: Normocephalic and atraumatic.  Mouth/Throat: Oropharynx is clear and moist.  Eyes: Pupils are equal, round, and reactive to light. EOM are normal.  Neck: Normal range of motion. Neck supple. No thyromegaly present.  Cardiovascular: Normal rate and regular rhythm.   No murmur heard. Pulmonary/Chest: Effort normal and breath sounds normal. No respiratory distress. He has no wheezes. He has no rales. He exhibits no tenderness.  Abdominal: He exhibits no mass. There is tenderness. There is no rebound and no guarding.  Musculoskeletal: He exhibits no edema or tenderness.  Neurological: He is alert and oriented to person, place, and time.  Skin: Skin is warm and dry.  Psychiatric: He has a normal mood and affect. His behavior is normal. Judgment and thought content normal.  Nursing note and vitals reviewed.  BP 102/72 (BP Location: Right Arm, Patient Position: Sitting, Cuff Size: Normal)   Pulse 76   Temp 98.6 F (37 C) (Oral)   Ht 5\' 5"  (1.651 m)   Wt 149 lb (67.6 kg)   SpO2 98%   BMI 24.79 kg/m  Wt Readings from Last 3 Encounters:  11/01/16 149 lb (67.6 kg)  07/19/16 144 lb (65.3 kg)  06/06/16 144 lb (65.3 kg)     Lab Results  Component Value  Date   WBC 8.7 11/01/2016   HGB 16.0 11/01/2016   HCT 46.1 11/01/2016   PLT 221.0 11/01/2016   GLUCOSE 117 (H) 11/01/2016   CHOL 193 06/26/2015   TRIG 338 (H) 06/26/2015   HDL 47 06/26/2015   LDLCALC 78 06/26/2015   ALT 33 11/01/2016   AST 27 11/01/2016   NA 138 11/01/2016   K 3.8 11/01/2016   CL 102 11/01/2016   CREATININE 1.04 11/01/2016   BUN 8 11/01/2016   CO2 29 11/01/2016   TSH 0.94 11/01/2016   PSA 1.38 02/11/2015   HGBA1C 5.6 06/26/2015    Dg Chest 2 View  Result Date: 06/06/2016 CLINICAL DATA:  Right side chest pressure intermittent for 2 weeks, smoker, costochondritis EXAM: CHEST  2 VIEW COMPARISON:  10/02/2014 FINDINGS: Cardiomediastinal silhouette is stable. No infiltrate or pleural effusion. No pulmonary edema. Bony thorax is unremarkable. IMPRESSION: No active cardiopulmonary disease. Electronically Signed   By: Lahoma Crocker M.D.   On: 06/06/2016 15:27     Assessment & Plan:  Plan  I have discontinued Mr. Shorey amoxicillin. I am also having him maintain his levocetirizine and fluticasone.  No orders of the defined types were placed in this encounter.   Problem List Items Addressed This Visit      Unprioritized   Abnormal [place] (ultrasound) of abdomen    Hemangioma on [place] but pat concerned and states he has pain there and would like it checked again      Relevant Orders   US Abdomen Limited RUQ   Right-sided chest pain - Primary    Pt states this is not the same pain he has had and he would like this checked again cxr  Did discuss possibilty of stress but pt denies that this could be a possibility      Relevant Orders   DG Chest 2 View   CBC with Differential/Platelet (Completed)   Comprehensive metabolic panel (Completed)   TSH (Completed)    Other Visit Diagnoses    Need for prophylactic vaccination and inoculation against influenza       Relevant Orders   Flu Vaccine QUAD 36+ mos IM (Fluarix & Fluzone Quad PF (Completed)      Follow-up:  Return if symptoms worsen or fail to improve, for annual exam, fasting.  Ann Held, DO

## 2016-11-01 NOTE — Patient Instructions (Signed)
Chest Wall Pain °Chest wall pain is pain in or around the bones and muscles of your chest. Sometimes, an injury causes this pain. Sometimes, the cause may not be known. This pain may take several weeks or longer to get better. °Follow these instructions at home: °Pay attention to any changes in your symptoms. Take these actions to help with your pain: °· Rest as told by your health care provider. °· Avoid activities that cause pain. These include any activities that use your chest muscles or your abdominal and side muscles to lift heavy items. °· If directed, apply ice to the painful area: °¨ Put ice in a plastic bag. °¨ Place a towel between your skin and the bag. °¨ Leave the ice on for 20 minutes, 2-3 times per day. °· Take over-the-counter and prescription medicines only as told by your health care provider. °· Do not use tobacco products, including cigarettes, chewing tobacco, and e-cigarettes. If you need help quitting, ask your health care provider. °· Keep all follow-up visits as told by your health care provider. This is important. °Contact a health care provider if: °· You have a fever. °· Your chest pain becomes worse. °· You have new symptoms. °Get help right away if: °· You have nausea or vomiting. °· You feel sweaty or light-headed. °· You have a cough with phlegm (sputum) or you cough up blood. °· You develop shortness of breath. °This information is not intended to replace advice given to you by your health care provider. Make sure you discuss any questions you have with your health care provider. °Document Released: 02/14/2005 Document Revised: 06/25/2015 Document Reviewed: 05/12/2014 °Elsevier Interactive Patient Education © 2017 [employer] ° °

## 2016-11-01 NOTE — Assessment & Plan Note (Signed)
Hemangioma on [place] but pat concerned and states he has pain there and would like it checked again

## 2016-11-07 ENCOUNTER — Ambulatory Visit (HOSPITAL_BASED_OUTPATIENT_CLINIC_OR_DEPARTMENT_OTHER)
Admission: RE | Admit: 2016-11-07 | Discharge: 2016-11-07 | Disposition: A | Discharge status: Home / Self Care | Payer: BLUE CROSS/BLUE SHIELD | Source: Ambulatory Visit | Attending: Family Medicine | Admitting: Family Medicine

## 2016-11-07 DIAGNOSIS — R935 Abnormal findings on diagnostic imaging of other abdominal regions, including retroperitoneum: Secondary | ICD-10-CM | POA: Insufficient documentation

## 2016-11-07 DIAGNOSIS — R079 Chest pain, unspecified: Secondary | ICD-10-CM | POA: Insufficient documentation

## 2017-03-31 ENCOUNTER — Ambulatory Visit: Payer: Self-pay | Admitting: Family Medicine

## 2017-10-27 ENCOUNTER — Encounter: Payer: BLUE CROSS/BLUE SHIELD | Admitting: Family Medicine

## 2017-12-26 ENCOUNTER — Encounter: Payer: Self-pay | Admitting: Family Medicine

## 2017-12-26 ENCOUNTER — Ambulatory Visit (INDEPENDENT_AMBULATORY_CARE_PROVIDER_SITE_OTHER): Payer: Managed Care, Other (non HMO) | Admitting: Family Medicine

## 2017-12-26 VITALS — BP 115/62 | HR 74 | Temp 98.2°F | Resp 16 | Ht 65.0 in | Wt 144.2 lb

## 2017-12-26 DIAGNOSIS — Z0001 Encounter for general adult medical examination with abnormal findings: Secondary | ICD-10-CM

## 2017-12-26 DIAGNOSIS — M25561 Pain in right knee: Secondary | ICD-10-CM

## 2017-12-26 DIAGNOSIS — Z0000 Encounter for general adult medical examination without abnormal findings: Secondary | ICD-10-CM

## 2017-12-26 DIAGNOSIS — Z23 Encounter for immunization: Secondary | ICD-10-CM | POA: Diagnosis not present

## 2017-12-26 DIAGNOSIS — G8929 Other chronic pain: Secondary | ICD-10-CM | POA: Diagnosis not present

## 2017-12-26 NOTE — Progress Notes (Signed)
Patient ID: NYCHOLAS RAYNER, male    DOB: 1985/10/08  Age: 32 y.o. MRN: [id number]    Subjective:  Subjective  HPI TABOR DENHAM presents for cpe and labs.   Pt c/o feeling like something is moving in his chest and R low leg.  It occurs every 3-4 days.  It does not bother him ----its a weird feeling in chest and leg.  It does not cause sob or chest pain--- his leg does not hurt ---- he does admit to being under a lot more stress at work since may.   Pt is sleeping well .   No burping,  No gas pains in stomach.   Pt also c/o R knee pain--- no known injury  Review of Systems  Constitutional: Negative.   HENT: Negative for congestion, ear pain, hearing loss, nosebleeds, postnasal drip, rhinorrhea, sinus pressure, sneezing and tinnitus.   Eyes: Negative for photophobia, discharge, itching and visual disturbance.  Respiratory: Negative.   Cardiovascular: Negative.   Gastrointestinal: Negative for abdominal distention, abdominal pain, anal bleeding, blood in stool and constipation.  Endocrine: Negative.   Genitourinary: Negative.   Musculoskeletal: Negative.   Skin: Negative.   Allergic/Immunologic: Negative.   Neurological: Negative for dizziness, weakness, light-headedness, numbness and headaches.  Psychiatric/Behavioral: Negative for agitation, confusion, decreased concentration, dysphoric mood, sleep disturbance and suicidal ideas. The patient is not nervous/anxious.     History Past Medical History:  Diagnosis Date  . History of hemorrhoids    history of since child  . Other specified disorders of liver   . Tonsillitis   . Urinary dysfunction    per [facility]    He has a past surgical history that includes No past surgeries.   His family history includes Hypertension in his mother; Kidney disease in his paternal uncle.He reports that he has been smoking cigarettes. He has a 5.00 pack-year smoking history. He has never used smokeless tobacco. He reports that he does not  drink alcohol or use drugs.  No current outpatient medications on file prior to visit.   No current facility-administered medications on file prior to visit.      Objective:  Objective  Physical Exam  Constitutional: He is oriented to person, place, and time. He appears well-developed and well-nourished. No distress.  HENT:  Head: Normocephalic and atraumatic.  Right Ear: External ear normal.  Left Ear: External ear normal.  Nose: Nose normal.  Mouth/Throat: Oropharynx is clear and moist. No oropharyngeal exudate.  Eyes: Pupils are equal, round, and reactive to light. Conjunctivae and EOM are normal. Right eye exhibits no discharge. Left eye exhibits no discharge.  Neck: Normal range of motion. Neck supple. No JVD present. No thyromegaly present.  Cardiovascular: Normal rate, regular rhythm, normal heart sounds and intact distal pulses.  No murmur heard. Pulmonary/Chest: Effort normal and breath sounds normal. No respiratory distress. He has no wheezes. He has no rales. He exhibits no tenderness.  Abdominal: Soft. Bowel sounds are normal. He exhibits no distension and no mass. There is no tenderness. There is no rebound and no guarding.  Musculoskeletal: Normal range of motion. He exhibits no edema or tenderness.  Lymphadenopathy:    He has no cervical adenopathy.  Neurological: He is alert and oriented to person, place, and time. He has normal reflexes. He displays normal reflexes. No cranial nerve deficit. He exhibits normal muscle tone.  Skin: Skin is warm and dry. No rash noted. He is not diaphoretic. No erythema.  Psychiatric: He has a  normal mood and affect. His behavior is normal. Judgment and thought content normal.  Nursing note and vitals reviewed.  BP 115/62 (BP Location: Right Arm, Cuff Size: Normal)   Pulse 74   Temp 98.2 F (36.8 C) (Oral)   Resp 16   Ht 5\' 5"  (1.651 m)   Wt 144 lb 3.2 oz (65.4 kg)   SpO2 100%   BMI 24.00 kg/m  Wt Readings from Last 3  Encounters:  12/26/17 144 lb 3.2 oz (65.4 kg)  11/01/16 149 lb (67.6 kg)  07/19/16 144 lb (65.3 kg)     Lab Results  Component Value Date   WBC 8.7 11/01/2016   HGB 16.0 11/01/2016   HCT 46.1 11/01/2016   PLT 221.0 11/01/2016   GLUCOSE 117 (H) 11/01/2016   CHOL 193 06/26/2015   TRIG 338 (H) 06/26/2015   HDL 47 06/26/2015   LDLCALC 78 06/26/2015   ALT 33 11/01/2016   AST 27 11/01/2016   NA 138 11/01/2016   K 3.8 11/01/2016   CL 102 11/01/2016   CREATININE 1.04 11/01/2016   BUN 8 11/01/2016   CO2 29 11/01/2016   TSH 0.94 11/01/2016   PSA 1.38 02/11/2015   HGBA1C 5.6 06/26/2015    Dg Chest 2 View  Result Date: 11/07/2016 CLINICAL DATA:  Right-sided chest pain intermittently for a few months with a popping sensation of the sternum. EXAM: CHEST  2 VIEW COMPARISON:  06/06/2016. FINDINGS: Trachea is midline. Heart size normal. Lungs may be minimally hyperinflated but are clear. No pleural fluid. IMPRESSION: Negative. Electronically Signed   By: Lorin Picket M.D.   On: 11/07/2016 08:31   US Abdomen Limited Ruq  Result Date: 11/07/2016 CLINICAL DATA:  Upper abdominal discomfort. History of abnormal ultrasound. No given history of malignancy. EXAM: ULTRASOUND ABDOMEN LIMITED RIGHT UPPER QUADRANT COMPARISON:  Previous ultrasound 11/30/2012 and 08/15/2013. Noncontrast chest CT 07/04/2015. FINDINGS: Gallbladder: No gallstones or wall thickening visualized. No sonographic Murphy sign noted by sonographer. Common bile duct: Diameter: 3 mm Liver: A previously demonstrated echogenic lesion centrally in the right hepatic lobe is grossly stable, measuring approximately 6 x 6 x 6 mm. Two additional, similar-appearing echogenic lesions are seen, 1 inferiorly in the right lobe measuring 6 x 5 x 6 mm and 1 superiorly in the left lobe measuring 5 x 4 x 4 mm. These were not previously seen. Portal vein is patent on color Doppler imaging with normal direction of blood flow towards the liver.  IMPRESSION: 1. No acute findings or explanation for the patient's symptoms. 2. 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 Only 1 of these was seen on previous studies and appears unchanged. Electronically Signed   By: Richardean Sale M.D.   On: 11/07/2016 09:14     Assessment & Plan:  Plan  I have discontinued Eliza K. Battenfield's levocetirizine and fluticasone.  No orders of the defined types were placed in this encounter.   Problem List Items Addressed This Visit      Unprioritized   Preventative health care - Primary    ghm utd Check labs  See avs        Relevant Orders   CBC with Differential/Platelet   Comprehensive metabolic panel   Lipid panel   TSH    Other Visit Diagnoses    Chronic pain of right knee       Relevant Orders   Ambulatory referral to Sports Medicine      Follow-up: Return in about 1 year (around  12/27/2018) for annual exam, fasting.  Ann Held, DO

## 2017-12-26 NOTE — Patient Instructions (Signed)
Preventive Care 18-39 Years, Male Preventive care refers to lifestyle choices and visits with your health care provider that can promote health and wellness. What does preventive care include?  A yearly physical exam. This is also called an annual well check.  Dental exams once or twice a year.  Routine eye exams. Ask your health care provider how often you should have your eyes checked.  Personal lifestyle choices, including: ? Daily care of your teeth and gums. ? Regular physical activity. ? Eating a healthy diet. ? Avoiding tobacco and drug use. ? Limiting alcohol use. ? Practicing safe sex. What happens during an annual well check? The services and screenings done by your health care provider during your annual well check will depend on your age, overall health, lifestyle risk factors, and family history of disease. Counseling Your health care provider may ask you questions about your:  Alcohol use.  Tobacco use.  Drug use.  Emotional well-being.  Home and relationship well-being.  Sexual activity.  Eating habits.  Work and work Statistician.  Screening You may have the following tests or measurements:  Height, weight, and BMI.  Blood pressure.  Lipid and cholesterol levels. These may be checked every 5 years starting at age 34.  Diabetes screening. This is done by checking your blood sugar (glucose) after you have not eaten for a while (fasting).  Skin check.  Hepatitis C blood test.  Hepatitis B blood test.  Sexually transmitted disease (STD) testing.  Discuss your test results, treatment options, and if necessary, the need for more tests with your health care provider. Vaccines Your health care provider may recommend certain vaccines, such as:  Influenza vaccine. This is recommended every year.  Tetanus, diphtheria, and acellular pertussis (Tdap, Td) vaccine. You may need a Td booster every 10 years.  Varicella vaccine. You may need this if you  have not been vaccinated.  HPV vaccine. If you are 23 or younger, you may need three doses over 6 months.  Measles, mumps, and rubella (MMR) vaccine. You may need at least one dose of MMR.You may also need a second dose.  Pneumococcal 13-valent conjugate (PCV13) vaccine. You may need this if you have certain conditions and have not been vaccinated.  Pneumococcal polysaccharide (PPSV23) vaccine. You may need one or two doses if you smoke cigarettes or if you have certain conditions.  Meningococcal vaccine. One dose is recommended if you are age 65-21 years and a first-year college student living in a residence hall, or if you have one of several medical conditions. You may also need additional booster doses.  Hepatitis A vaccine. You may need this if you have certain conditions or if you travel or work in places where you may be exposed to hepatitis A.  Hepatitis B vaccine. You may need this if you have certain conditions or if you travel or work in places where you may be exposed to hepatitis B.  Haemophilus influenzae type b (Hib) vaccine. You may need this if you have certain risk factors.  Talk to your health care provider about which screenings and vaccines you need and how often you need them. This information is not intended to replace advice given to you by your health care provider. Make sure you discuss any questions you have with your health care provider. Document Released: 04/12/2001 Document Revised: 11/04/2015 Document Reviewed: 12/16/2014 Elsevier Interactive Patient Education  [employer].

## 2017-12-26 NOTE — Assessment & Plan Note (Signed)
ghm utd Check labs  See avs  

## 2017-12-27 LAB — COMPREHENSIVE METABOLIC PANEL
ALBUMIN: 4.7 g/dL (ref 3.5–5.2)
ALT: 23 U/L (ref 0–53)
AST: 15 U/L (ref 0–37)
Alkaline Phosphatase: 55 U/L (ref 39–117)
BUN: 8 mg/dL (ref 6–23)
CALCIUM: 9.5 mg/dL (ref 8.4–10.5)
CO2: 29 meq/L (ref 19–32)
Chloride: 100 mEq/L (ref 96–112)
Creatinine, Ser: 0.97 mg/dL (ref 0.40–1.50)
GFR: 95.17 mL/min (ref >60.00)
Glucose, Bld: 185 mg/dL — ABNORMAL HIGH (ref 70–99)
Potassium: 3.6 mEq/L (ref 3.5–5.1)
Sodium: 138 mEq/L (ref 135–145)
Total Bilirubin: 0.7 mg/dL (ref 0.2–1.2)
Total Protein: 7.2 g/dL (ref 6.0–8.3)

## 2017-12-27 LAB — CBC WITH DIFFERENTIAL/PLATELET
BASOS ABS: 0.1 10*3/uL (ref 0.0–0.1)
BASOS PCT: 0.8 % (ref 0.0–3.0)
Eosinophils Absolute: 0.2 10*3/uL (ref 0.0–0.7)
Eosinophils Relative: 2.6 % (ref 0.0–5.0)
HEMATOCRIT: 47.4 % (ref 39.0–52.0)
Hemoglobin: 16.4 g/dL (ref 13.0–17.0)
LYMPHS PCT: 27.7 % (ref 12.0–46.0)
Lymphs Abs: 2.1 10*3/uL (ref 0.7–4.0)
MCHC: 34.6 g/dL (ref 30.0–36.0)
MCV: 91.1 fl (ref 78.0–100.0)
MONOS PCT: 6.1 % (ref 3.0–12.0)
Monocytes Absolute: 0.5 10*3/uL (ref 0.1–1.0)
NEUTROS ABS: 4.6 10*3/uL (ref 1.4–7.7)
Neutrophils Relative %: 62.8 % (ref 43.0–77.0)
PLATELETS: 226 10*3/uL (ref 150.0–400.0)
RBC: 5.2 Mil/uL (ref 4.22–5.81)
RDW: 12.5 % (ref 11.5–15.5)
WBC: 7.4 10*3/uL (ref 4.0–10.5)

## 2017-12-27 LAB — LIPID PANEL
CHOL/HDL RATIO: 5
Cholesterol: 212 mg/dL — ABNORMAL HIGH (ref 0–200)
HDL: 44.6 mg/dL (ref >39.00)
NONHDL: 167.34
TRIGLYCERIDES: 255 mg/dL — AB (ref 0.0–149.0)
VLDL: 51 mg/dL — AB (ref 0.0–40.0)

## 2017-12-27 LAB — TSH: TSH: 1.39 u[IU]/mL (ref 0.35–4.50)

## 2017-12-27 LAB — LDL CHOLESTEROL, DIRECT: Direct LDL: 157 mg/dL

## 2017-12-27 NOTE — Addendum Note (Signed)
Addended by: Kem Boroughs D on: 12/27/2017 11:24 AM   Modules accepted: Orders

## 2017-12-28 ENCOUNTER — Encounter: Payer: Self-pay | Admitting: Family Medicine

## 2017-12-28 ENCOUNTER — Ambulatory Visit (HOSPITAL_BASED_OUTPATIENT_CLINIC_OR_DEPARTMENT_OTHER)
Admission: RE | Admit: 2017-12-28 | Discharge: 2017-12-28 | Disposition: A | Discharge status: Home / Self Care | Payer: Managed Care, Other (non HMO) | Source: Ambulatory Visit | Attending: Family Medicine | Admitting: Family Medicine

## 2017-12-28 ENCOUNTER — Ambulatory Visit (INDEPENDENT_AMBULATORY_CARE_PROVIDER_SITE_OTHER): Payer: Managed Care, Other (non HMO) | Admitting: Family Medicine

## 2017-12-28 ENCOUNTER — Other Ambulatory Visit (INDEPENDENT_AMBULATORY_CARE_PROVIDER_SITE_OTHER): Payer: Managed Care, Other (non HMO)

## 2017-12-28 VITALS — BP 115/66 | HR 68 | Ht 65.0 in | Wt 142.0 lb

## 2017-12-28 DIAGNOSIS — M79604 Pain in right leg: Secondary | ICD-10-CM

## 2017-12-28 DIAGNOSIS — R739 Hyperglycemia, unspecified: Secondary | ICD-10-CM

## 2017-12-28 LAB — HEMOGLOBIN A1C: HEMOGLOBIN A1C: 5.7 % (ref 4.6–6.5)

## 2017-12-28 NOTE — Patient Instructions (Addendum)
Your x-rays look great.   You have patellar tendinitis. Ice area 15 minutes at a time 3-4 times a day as needed. Aleve 2 tabs twice a day with food OR ibuprofen 600mg  three times a day with food for pain and inflammation. Straight leg raises, knee extensions, decline squats 3 sets of 10 once a day. Patellar tendon strap when doing exercises like your pushups that aggravate this. Consider physical therapy. Follow up with me in 6 weeks for reevaluation.

## 2017-12-28 NOTE — Progress Notes (Signed)
PCP and consultation requested by: Ann Held, DO  Subjective:   HPI: Patient is a 32 y.o. male here for right knee pain.  Patient reports he's had intermittent anterior right knee pain the past month. Started after doing pushups when he felt pain anterior knee about insertion of patellar tendon. Pain was sharp, had difficulty moving his leg. This happened one other time but also resolved. He tried wrapping knee, topical ben-gay. Pain currently 0/10. No skin changes, numbness. He states there was localized swelling with this. No catching, locking, giving out. He also reports intermittent circulating feeling anterior lower leg without numbness, tingling, burning. No back pain associated with this and no pain in thigh area.  Has similar feeling in chest.  Past Medical History:  Diagnosis Date  . History of hemorrhoids    history of since child  . Other specified disorders of liver   . Tonsillitis   . Urinary dysfunction    per [facility]    Current Outpatient Medications on File Prior to Visit  Medication Sig Dispense Refill  . fluticasone (FLONASE) 50 MCG/ACT nasal spray 2 sprays by Each Nare route daily.    Marland Kitchen levocetirizine (XYZAL) 5 MG tablet Take by mouth.     No current facility-administered medications on file prior to visit.     Past Surgical History:  Procedure Laterality Date  . NO PAST SURGERIES      Allergies  Allergen Reactions  . Bactrim [Sulfamethoxazole-Trimethoprim] Other (See Comments) and Itching    Skin reactions.  Skin reactions.     Social History   Socioeconomic History  . Marital status: Single    Spouse name: Not on file  . Number of children: 0  . Years of education: Not on file  . Highest education level: Not on file  Occupational History  . Occupation: Health and safety inspector  Social Needs  . Financial resource strain: Not on file  . Food insecurity:    Worry: Not on file    Inability: Not on file  . Transportation needs:     Medical: Not on file    Non-medical: Not on file  Tobacco Use  . Smoking status: Current Every Day Smoker    Packs/day: 0.50    Years: 10.00    Pack years: 5.00    Types: Cigarettes  . Smokeless tobacco: Never Used  Substance and Sexual Activity  . Alcohol use: No    Alcohol/week: 4.0 standard drinks    Types: 4 Cans of beer per week    Comment: rarely   . Drug use: No  . Sexual activity: Not Currently    Partners: Female  Lifestyle  . Physical activity:    Days per week: Not on file    Minutes per session: Not on file  . Stress: Not on file  Relationships  . Social connections:    Talks on phone: Not on file    Gets together: Not on file    Attends religious service: Not on file    Active member of club or organization: Not on file    Attends meetings of clubs or organizations: Not on file    Relationship status: Not on file  . Intimate partner violence:    Fear of current or ex partner: Not on file    Emotionally abused: Not on file    Physically abused: Not on file    Forced sexual activity: Not on file  Other Topics Concern  . Not on file  Social History Narrative   Single, no [employer]   uses alcohol no drug use   Updated 08/06/2013          Family History  Problem Relation Age of Onset  . Hypertension Mother   . Kidney disease Paternal Uncle     BP 115/66   Pulse 68   Ht 5\' 5"  (1.651 m)   Wt 142 lb (64.4 kg)   BMI 23.63 kg/m   Review of Systems: See HPI above.     Objective:  Physical Exam:  Gen: NAD, comfortable in exam room  Right knee: No gross deformity, ecchymoses, swelling. No TTP currently. FROM with 5/5 strength flexion and extension. Negative ant/post drawers. Negative valgus/varus testing. Negative lachmans. Negative mcmurrays, apleys, patellar apprehension. NV intact distally.  Left knee: No deformity. FROM with 5/5 strength. No tenderness to palpation. NVI distally.   Assessment &  Plan:  1. Right knee pain - independently reviewed radiographs of tibia/fibula given unusual symptoms in lower leg and no abnormalities noted.  Describes intermittent patellar tendinitis.  Reviewed home exercise program to do daily.  Icing, aleve or ibuprofen.  Patellar tendon strap.  Consider physical therapy.  F/u in 6 weeks.

## 2017-12-29 ENCOUNTER — Other Ambulatory Visit: Payer: Self-pay

## 2017-12-29 ENCOUNTER — Encounter: Payer: Self-pay | Admitting: Family Medicine

## 2017-12-29 DIAGNOSIS — R739 Hyperglycemia, unspecified: Secondary | ICD-10-CM

## 2017-12-29 DIAGNOSIS — E785 Hyperlipidemia, unspecified: Secondary | ICD-10-CM

## 2018-02-05 ENCOUNTER — Ambulatory Visit: Payer: Managed Care, Other (non HMO) | Admitting: Family Medicine

## 2018-11-11 ENCOUNTER — Encounter: Payer: Self-pay | Admitting: Family Medicine

## 2018-11-12 ENCOUNTER — Other Ambulatory Visit: Payer: Self-pay

## 2018-11-12 DIAGNOSIS — Z20828 Contact with and (suspected) exposure to other viral communicable diseases: Secondary | ICD-10-CM

## 2018-11-12 DIAGNOSIS — Z20822 Contact with and (suspected) exposure to covid-19: Secondary | ICD-10-CM

## 2018-11-12 DIAGNOSIS — Z0000 Encounter for general adult medical examination without abnormal findings: Secondary | ICD-10-CM

## 2018-11-12 NOTE — Telephone Encounter (Signed)
[facility] order just needs to be placed He should found out when he needs to get it though--- a lot of place want it no more then 8 hours old

## 2018-11-21 ENCOUNTER — Other Ambulatory Visit: Payer: Self-pay

## 2018-11-21 ENCOUNTER — Ambulatory Visit (INDEPENDENT_AMBULATORY_CARE_PROVIDER_SITE_OTHER): Payer: Managed Care, Other (non HMO)

## 2018-11-21 DIAGNOSIS — Z23 Encounter for immunization: Secondary | ICD-10-CM

## 2019-01-07 ENCOUNTER — Encounter: Payer: Managed Care, Other (non HMO) | Admitting: Family Medicine

## 2019-02-01 ENCOUNTER — Ambulatory Visit (INDEPENDENT_AMBULATORY_CARE_PROVIDER_SITE_OTHER): Payer: Managed Care, Other (non HMO) | Admitting: Family Medicine

## 2019-02-01 ENCOUNTER — Encounter: Payer: Self-pay | Admitting: Family Medicine

## 2019-02-01 ENCOUNTER — Other Ambulatory Visit: Payer: Self-pay

## 2019-02-01 VITALS — BP 118/60 | HR 99 | Temp 97.9°F | Resp 18 | Ht 65.0 in | Wt 149.4 lb

## 2019-02-01 DIAGNOSIS — Z0000 Encounter for general adult medical examination without abnormal findings: Secondary | ICD-10-CM

## 2019-02-01 DIAGNOSIS — R002 Palpitations: Secondary | ICD-10-CM

## 2019-02-01 NOTE — Assessment & Plan Note (Signed)
ghm utd Check labs See AVS 

## 2019-02-01 NOTE — Patient Instructions (Signed)
Preventive Care 19-33 Years Old, Male Preventive care refers to lifestyle choices and visits with your health care provider that can promote health and wellness. This includes:  A yearly physical exam. This is also called an annual well check.  Regular dental and eye exams.  Immunizations.  Screening for certain conditions.  Healthy lifestyle choices, such as eating a healthy diet, getting regular exercise, not using drugs or products that contain nicotine and tobacco, and limiting alcohol use. What can I expect for my preventive care visit? Physical exam Your health care provider will check:  Height and weight. These may be used to calculate body mass index (BMI), which is a measurement that tells if you are at a healthy weight.  Heart rate and blood pressure.  Your skin for abnormal spots. Counseling Your health care provider may ask you questions about:  Alcohol, tobacco, and drug use.  Emotional well-being.  Home and relationship well-being.  Sexual activity.  Eating habits.  Work and work Statistician. What immunizations do I need?  Influenza (flu) vaccine  This is recommended every year. Tetanus, diphtheria, and pertussis (Tdap) vaccine  You may need a Td booster every 10 years. Varicella (chickenpox) vaccine  You may need this vaccine if you have not already been vaccinated. Human papillomavirus (HPV) vaccine  If recommended by your health care provider, you may need three doses over 6 months. Measles, mumps, and rubella (MMR) vaccine  You may need at least one dose of MMR. You may also need a second dose. Meningococcal conjugate (MenACWY) vaccine  One dose is recommended if you are 45-76 years old and a Market researcher living in a residence hall, or if you have one of several medical conditions. You may also need additional booster doses. Pneumococcal conjugate (PCV13) vaccine  You may need this if you have certain conditions and were not  previously vaccinated. Pneumococcal polysaccharide (PPSV23) vaccine  You may need one or two doses if you smoke cigarettes or if you have certain conditions. Hepatitis A vaccine  You may need this if you have certain conditions or if you travel or work in places where you may be exposed to hepatitis A. Hepatitis B vaccine  You may need this if you have certain conditions or if you travel or work in places where you may be exposed to hepatitis B. Haemophilus influenzae type b (Hib) vaccine  You may need this if you have certain risk factors. You may receive vaccines as individual doses or as more than one vaccine together in one shot (combination vaccines). Talk with your health care provider about the risks and benefits of combination vaccines. What tests do I need? Blood tests  Lipid and cholesterol levels. These may be checked every 5 years starting at age 17.  Hepatitis C test.  Hepatitis B test. Screening   Diabetes screening. This is done by checking your blood sugar (glucose) after you have not eaten for a while (fasting).  Sexually transmitted disease (STD) testing. Talk with your health care provider about your test results, treatment options, and if necessary, the need for more tests. Follow these instructions at home: Eating and drinking   Eat a diet that includes fresh fruits and vegetables, whole grains, lean protein, and low-fat dairy products.  Take vitamin and mineral supplements as recommended by your health care provider.  Do not drink alcohol if your health care provider tells you not to drink.  If you drink alcohol: ? Limit how much you have to 0-2  drinks a day. ? Be aware of how much alcohol is in your drink. In the [place] one drink equals one 12 oz bottle of beer (355 mL), one 5 oz glass of wine (148 mL), or one 1 oz glass of hard liquor (44 mL). Lifestyle  Take daily care of your teeth and gums.  Stay active. Exercise for at least 30 minutes on 5 or  more days each week.  Do not use any products that contain nicotine or tobacco, such as cigarettes, e-cigarettes, and chewing tobacco. If you need help quitting, ask your health care provider.  If you are sexually active, practice safe sex. Use a condom or other form of protection to prevent STIs (sexually transmitted infections). What's next?  Go to your health care provider once a year for a well check visit.  Ask your health care provider how often you should have your eyes and teeth checked.  Stay up to date on all vaccines. This information is not intended to replace advice given to you by your health care provider. Make sure you discuss any questions you have with your health care provider. Document Released: 04/12/2001 Document Revised: 02/08/2018 Document Reviewed: 02/08/2018 Elsevier Patient Education  2020 Else[employer] Inc[employer]  

## 2019-02-01 NOTE — Assessment & Plan Note (Signed)
ekg-- NSR Check labs If symptoms con't pt to rto for further evaluation

## 2019-02-01 NOTE — Progress Notes (Signed)
Patient ID: Tyler [name], male    DOB: Jul 12, 1985  Age: 33 y.o. MRN: [id number]    Subjective:  Subjective  HPI EULIE GODA presents for cpe and labs He c/o inc stress at work and palpations on occasion -- no sob , no cp   Review of Systems  Constitutional: Negative.   HENT: Negative for congestion, ear pain, hearing loss, nosebleeds, postnasal drip, rhinorrhea, sinus pressure, sneezing and tinnitus.   Eyes: Negative for photophobia, discharge, itching and visual disturbance.  Respiratory: Negative.   Cardiovascular: Positive for palpitations. Negative for chest pain and leg swelling.  Gastrointestinal: Negative for abdominal distention, abdominal pain, anal bleeding, blood in stool and constipation.  Endocrine: Negative.   Genitourinary: Negative.   Musculoskeletal: Negative.   Skin: Negative.   Allergic/Immunologic: Negative.   Neurological: Negative for dizziness, weakness, light-headedness, numbness and headaches.  Psychiatric/Behavioral: Negative for agitation, confusion, decreased concentration, dysphoric mood, sleep disturbance and suicidal ideas. The patient is not nervous/anxious.     History Past Medical History:  Diagnosis Date  . History of hemorrhoids    history of since child  . Other specified disorders of liver   . Tonsillitis   . Urinary dysfunction    per [facility]    He has a past surgical history that includes No past surgeries.   His family history includes Hypertension in his mother; Kidney disease in his paternal uncle.He reports that he has been smoking cigarettes. He has a 5.00 pack-year smoking history. He has never used smokeless tobacco. He reports that he does not drink alcohol or use drugs.  Current Outpatient Medications on File Prior to Visit  Medication Sig Dispense Refill  . fluticasone (FLONASE) 50 MCG/ACT nasal spray 2 sprays by Each Nare route daily.    Marland Kitchen levocetirizine (XYZAL) 5 MG tablet Take by mouth.     No  current facility-administered medications on file prior to visit.      Objective:  Objective  Physical Exam Vitals signs and nursing note reviewed.  Constitutional:      General: He is not in acute distress.    Appearance: He is well-developed. He is not diaphoretic.  HENT:     Head: Normocephalic and atraumatic.     Right Ear: External ear normal.     Left Ear: External ear normal.     Nose: Nose normal.     Mouth/Throat:     Pharynx: No oropharyngeal exudate.  Eyes:     General:        Right eye: No discharge.        Left eye: No discharge.     Conjunctiva/sclera: Conjunctivae normal.     Pupils: Pupils are equal, round, and reactive to light.  Neck:     Musculoskeletal: Normal range of motion and neck supple.     Thyroid: No thyromegaly.     Vascular: No JVD.  Cardiovascular:     Rate and Rhythm: Normal rate and regular rhythm.     Heart sounds: No murmur. No friction rub. No gallop.   Pulmonary:     Effort: Pulmonary effort is normal. No respiratory distress.     Breath sounds: Normal breath sounds. No wheezing or rales.  Chest:     Chest wall: No tenderness.  Abdominal:     General: Bowel sounds are normal. There is no distension.     Palpations: Abdomen is soft. There is no mass.     Tenderness: There is no abdominal tenderness. There is  no guarding or rebound.  Genitourinary:    Penis: Normal.   Musculoskeletal: Normal range of motion.        General: No tenderness.  Lymphadenopathy:     Cervical: No cervical adenopathy.  Skin:    General: Skin is warm and dry.     Coloration: Skin is not pale.     Findings: No erythema or rash.  Neurological:     Mental Status: He is alert and oriented to person, place, and time.     Motor: No abnormal muscle tone.     Deep Tendon Reflexes: Reflexes are normal and symmetric. Reflexes normal.  Psychiatric:        Behavior: Behavior normal.        Thought Content: Thought content normal.        Judgment: Judgment normal.     BP 118/60 (BP Location: Left Arm, Patient Position: Sitting, Cuff Size: Normal)   Pulse 99   Temp 97.9 F (36.6 C) (Temporal)   Resp 18   Ht 5\' 5"  (1.651 m)   Wt 149 lb 6.4 oz (67.8 kg)   SpO2 97%   BMI 24.86 kg/m  Wt Readings from Last 3 Encounters:  02/01/19 149 lb 6.4 oz (67.8 kg)  12/28/17 142 lb (64.4 kg)  12/26/17 144 lb 3.2 oz (65.4 kg)     Lab Results  Component Value Date   WBC 7.4 12/26/2017   HGB 16.4 12/26/2017   HCT 47.4 12/26/2017   PLT 226.0 12/26/2017   GLUCOSE 185 (H) 12/26/2017   CHOL 212 (H) 12/26/2017   TRIG 255.0 (H) 12/26/2017   HDL 44.60 12/26/2017   LDLDIRECT 157.0 12/26/2017   LDLCALC 78 06/26/2015   ALT 23 12/26/2017   AST 15 12/26/2017   NA 138 12/26/2017   K 3.6 12/26/2017   CL 100 12/26/2017   CREATININE 0.97 12/26/2017   BUN 8 12/26/2017   CO2 29 12/26/2017   TSH 1.39 12/26/2017   PSA 1.38 02/11/2015   HGBA1C 5.7 12/28/2017    Dg Tibia/fibula Right  Result Date: 12/28/2017 CLINICAL DATA:  Right lower leg pain for 2 months without known injury. EXAM: RIGHT TIBIA AND FIBULA - 2 VIEW COMPARISON:  None. FINDINGS: There is no evidence of fracture or other focal bone lesions. Soft tissues are unremarkable. IMPRESSION: Negative. Electronically Signed   By: Marijo Conception, M.D.   On: 12/28/2017 16:23   EKG-- NSR    Assessment & Plan:  Plan  I am having Beckem K. Ende maintain his fluticasone and levocetirizine.  No orders of the defined types were placed in this encounter.   Problem List Items Addressed This Visit      Unprioritized   Palpitations    ekg-- NSR Check labs If symptoms con't pt to rto for further evaluation      Relevant Orders   EKG 12-Lead (Completed)   CBC with Differential   TSH   Comprehensive metabolic panel   Preventative health care - Primary    ghm utd Check labs  See AVS      Relevant Orders   Lipid panel   CBC with Differential   TSH   Comprehensive metabolic panel       Follow-up: Return in about 1 year (around 02/01/2020) for annual exam, fasting.  Ann Held, DO

## 2019-02-02 LAB — CBC WITH DIFFERENTIAL/PLATELET
Absolute Monocytes: 482 cells/uL (ref 200–950)
Basophils Absolute: 50 cells/uL (ref 0–200)
Basophils Relative: 0.7 %
Eosinophils Absolute: 202 cells/uL (ref 15–500)
Eosinophils Relative: 2.8 %
HCT: 46.9 % (ref 38.5–50.0)
Hemoglobin: 16.3 g/dL (ref 13.2–17.1)
Lymphs Abs: 2311 cells/uL (ref 850–3900)
MCH: 30.9 pg (ref 27.0–33.0)
MCHC: 34.8 g/dL (ref 32.0–36.0)
MCV: 89 fL (ref 80.0–100.0)
MPV: 9.5 fL (ref 7.5–12.5)
Monocytes Relative: 6.7 %
Neutro Abs: 4154 cells/uL (ref 1500–7800)
Neutrophils Relative %: 57.7 %
Platelets: 244 10*3/uL (ref 140–400)
RBC: 5.27 10*6/uL (ref 4.20–5.80)
RDW: 12 % (ref 11.0–15.0)
Total Lymphocyte: 32.1 %
WBC: 7.2 10*3/uL (ref 3.8–10.8)

## 2019-02-02 LAB — COMPREHENSIVE METABOLIC PANEL
AG Ratio: 1.8 (calc) (ref 1.0–2.5)
ALT: 42 U/L (ref 9–46)
AST: 23 U/L (ref 10–40)
Albumin: 4.6 g/dL (ref 3.6–5.1)
Alkaline phosphatase (APISO): 65 U/L (ref 36–130)
BUN: 11 mg/dL (ref 7–25)
CO2: 26 mmol/L (ref 20–32)
Calcium: 9.7 mg/dL (ref 8.6–10.3)
Chloride: 102 mmol/L (ref 98–110)
Creat: 1.17 mg/dL (ref 0.60–1.35)
Globulin: 2.5 g/dL (calc) (ref 1.9–3.7)
Glucose, Bld: 106 mg/dL — ABNORMAL HIGH (ref 65–99)
Potassium: 3.8 mmol/L (ref 3.5–5.3)
Sodium: 139 mmol/L (ref 135–146)
Total Bilirubin: 0.4 mg/dL (ref 0.2–1.2)
Total Protein: 7.1 g/dL (ref 6.1–8.1)

## 2019-02-02 LAB — TSH: TSH: 0.8 mIU/L (ref 0.40–4.50)

## 2019-02-02 LAB — LIPID PANEL
Cholesterol: 208 mg/dL — ABNORMAL HIGH (ref <200)
HDL: 47 mg/dL (ref >=40)
LDL Cholesterol (Calc): 125 mg/dL (calc) — ABNORMAL HIGH
Non-HDL Cholesterol (Calc): 161 mg/dL (calc) — ABNORMAL HIGH (ref <130)
Total CHOL/HDL Ratio: 4.4 (calc) (ref <5.0)
Triglycerides: 219 mg/dL — ABNORMAL HIGH (ref <150)

## 2019-02-04 ENCOUNTER — Other Ambulatory Visit: Payer: Self-pay | Admitting: Family Medicine

## 2019-02-04 DIAGNOSIS — R739 Hyperglycemia, unspecified: Secondary | ICD-10-CM

## 2019-02-04 DIAGNOSIS — E785 Hyperlipidemia, unspecified: Secondary | ICD-10-CM

## 2019-05-27 ENCOUNTER — Ambulatory Visit: Payer: Managed Care, Other (non HMO) | Attending: Internal Medicine

## 2019-05-27 DIAGNOSIS — Z23 Encounter for immunization: Secondary | ICD-10-CM

## 2019-05-27 NOTE — Progress Notes (Signed)
   Covid-19 Vaccination Clinic  Name:  Tyler [name]    MRN: [id number] DOB: 1986-02-18  05/27/2019  Mr. [name] was observed post Covid-19 immunization for 15 minutes without incident. He was provided with Vaccine Information Sheet and instruction to access the V-Safe system.   Mr. [name] was instructed to call 911 with any severe reactions post vaccine: Marland Kitchen Difficulty breathing  . Swelling of face and throat  . A fast heartbeat  . A bad rash all over body  . Dizziness and weakness   Immunizations Administered    Name Date Dose VIS Date Route   Pfizer COVID-19 Vaccine 05/27/2019  3:59 PM 0.3 mL 02/08/2019 Intramuscular   Manufacturer: [place]   Lot: [id number]   [place]: [id number]

## 2019-06-19 ENCOUNTER — Ambulatory Visit: Payer: Managed Care, Other (non HMO) | Attending: Internal Medicine

## 2019-06-19 DIAGNOSIS — Z23 Encounter for immunization: Secondary | ICD-10-CM

## 2019-06-19 NOTE — Progress Notes (Signed)
   Covid-19 Vaccination Clinic  Name:  Tyler [name]    MRN: [id number] DOB: 1985/12/14  06/19/2019  [name] was observed post Covid-19 immunization for 15 minutes without incident. He was provided with Vaccine Information Sheet and instruction to access the V-Safe system.   [name] was instructed to call 911 with any severe reactions post vaccine: Marland Kitchen Difficulty breathing  . Swelling of face and throat  . A fast heartbeat  . A bad rash all over body  . Dizziness and weakness   Immunizations Administered    Name Date Dose VIS Date Route   Pfizer COVID-19 Vaccine 06/19/2019  3:22 PM 0.3 mL 04/24/2018 Intramuscular   Manufacturer: [place]   Lot: [id number]   [place]: [id number]

## 2019-06-26 ENCOUNTER — Encounter: Payer: Self-pay | Admitting: Family Medicine

## 2019-06-26 DIAGNOSIS — R21 Rash and other nonspecific skin eruption: Secondary | ICD-10-CM

## 2019-06-26 DIAGNOSIS — L709 Acne, unspecified: Secondary | ICD-10-CM

## 2019-06-26 NOTE — Telephone Encounter (Signed)
Okay to place referral? Please advise °

## 2019-06-26 NOTE — Telephone Encounter (Signed)
Yes ---- derm for rash

## 2020-06-08 ENCOUNTER — Encounter: Payer: Managed Care, Other (non HMO) | Admitting: Family Medicine

## 2020-07-03 ENCOUNTER — Encounter: Payer: Managed Care, Other (non HMO) | Admitting: Family Medicine

## 2020-08-20 ENCOUNTER — Encounter: Payer: Managed Care, Other (non HMO) | Admitting: Family Medicine

## 2020-11-12 ENCOUNTER — Encounter: Payer: Self-pay | Admitting: Family Medicine

## 2020-11-12 ENCOUNTER — Other Ambulatory Visit: Payer: Self-pay | Admitting: Family Medicine

## 2020-11-12 ENCOUNTER — Ambulatory Visit (INDEPENDENT_AMBULATORY_CARE_PROVIDER_SITE_OTHER): Payer: Managed Care, Other (non HMO) | Admitting: Family Medicine

## 2020-11-12 ENCOUNTER — Other Ambulatory Visit: Payer: Self-pay

## 2020-11-12 VITALS — BP 98/70 | HR 84 | Temp 98.9°F | Resp 18 | Ht 65.0 in | Wt 142.0 lb

## 2020-11-12 DIAGNOSIS — R5383 Other fatigue: Secondary | ICD-10-CM | POA: Insufficient documentation

## 2020-11-12 DIAGNOSIS — Z23 Encounter for immunization: Secondary | ICD-10-CM

## 2020-11-12 DIAGNOSIS — R739 Hyperglycemia, unspecified: Secondary | ICD-10-CM

## 2020-11-12 DIAGNOSIS — Z0000 Encounter for general adult medical examination without abnormal findings: Secondary | ICD-10-CM | POA: Diagnosis not present

## 2020-11-12 DIAGNOSIS — Z3141 Encounter for fertility testing: Secondary | ICD-10-CM

## 2020-11-12 DIAGNOSIS — Z1159 Encounter for screening for other viral diseases: Secondary | ICD-10-CM | POA: Diagnosis not present

## 2020-11-12 DIAGNOSIS — R7989 Other specified abnormal findings of blood chemistry: Secondary | ICD-10-CM

## 2020-11-12 DIAGNOSIS — E785 Hyperlipidemia, unspecified: Secondary | ICD-10-CM

## 2020-11-12 LAB — COMPREHENSIVE METABOLIC PANEL
ALT: 37 U/L (ref 0–53)
AST: 21 U/L (ref 0–37)
Albumin: 4.7 g/dL (ref 3.5–5.2)
Alkaline Phosphatase: 70 U/L (ref 39–117)
BUN: 7 mg/dL (ref 6–23)
CO2: 31 mEq/L (ref 19–32)
Calcium: 9.9 mg/dL (ref 8.4–10.5)
Chloride: 101 mEq/L (ref 96–112)
Creatinine, Ser: 0.96 mg/dL (ref 0.40–1.50)
GFR: 102.67 mL/min (ref >60.00)
Glucose, Bld: 97 mg/dL (ref 70–99)
Potassium: 4.4 mEq/L (ref 3.5–5.1)
Sodium: 139 mEq/L (ref 135–145)
Total Bilirubin: 0.8 mg/dL (ref 0.2–1.2)
Total Protein: 7.7 g/dL (ref 6.0–8.3)

## 2020-11-12 LAB — CBC WITH DIFFERENTIAL/PLATELET
Basophils Absolute: 0.1 10*3/uL (ref 0.0–0.1)
Basophils Relative: 0.7 % (ref 0.0–3.0)
Eosinophils Absolute: 0.1 10*3/uL (ref 0.0–0.7)
Eosinophils Relative: 1.7 % (ref 0.0–5.0)
HCT: 47.9 % (ref 39.0–52.0)
Hemoglobin: 16.4 g/dL (ref 13.0–17.0)
Lymphocytes Relative: 21 % (ref 12.0–46.0)
Lymphs Abs: 1.6 10*3/uL (ref 0.7–4.0)
MCHC: 34.1 g/dL (ref 30.0–36.0)
MCV: 91.8 fl (ref 78.0–100.0)
Monocytes Absolute: 0.4 10*3/uL (ref 0.1–1.0)
Monocytes Relative: 5.2 % (ref 3.0–12.0)
Neutro Abs: 5.4 10*3/uL (ref 1.4–7.7)
Neutrophils Relative %: 71.4 % (ref 43.0–77.0)
Platelets: 229 10*3/uL (ref 150.0–400.0)
RBC: 5.22 Mil/uL (ref 4.22–5.81)
RDW: 12.6 % (ref 11.5–15.5)
WBC: 7.6 10*3/uL (ref 4.0–10.5)

## 2020-11-12 LAB — LIPID PANEL
Cholesterol: 248 mg/dL — ABNORMAL HIGH (ref 0–200)
HDL: 54.5 mg/dL (ref >39.00)
NonHDL: 193.78
Total CHOL/HDL Ratio: 5
Triglycerides: 204 mg/dL — ABNORMAL HIGH (ref 0.0–149.0)
VLDL: 40.8 mg/dL — ABNORMAL HIGH (ref 0.0–40.0)

## 2020-11-12 LAB — VITAMIN B12: Vitamin B-12: 348 pg/mL (ref 211–911)

## 2020-11-12 LAB — LDL CHOLESTEROL, DIRECT: Direct LDL: 177 mg/dL

## 2020-11-12 LAB — HEMOGLOBIN A1C: Hgb A1c MFr Bld: 5.9 % (ref 4.6–6.5)

## 2020-11-12 LAB — VITAMIN D 25 HYDROXY (VIT D DEFICIENCY, FRACTURES): VITD: <7 ng/mL — ABNORMAL LOW (ref 30.00–100.00)

## 2020-11-12 LAB — TSH: TSH: 1.59 u[IU]/mL (ref 0.35–5.50)

## 2020-11-12 NOTE — Progress Notes (Signed)
Subjective:   By signing my name below, I, Tyler [name], attest that this documentation has been prepared under the direction and in the presence of Dr. [name], DO. 11/12/2020.    Patient ID: Tyler [name], male    DOB: January 17, 1986, 35 y.o.   MRN: [id number]  Chief Complaint  Patient presents with   Annual Exam    Pt states fasting     HPI Patient is in today for comprehensive physical exam. He recently went to [place] for a trip. After coming back, he had one episode of fever for one week . However, he is doing well today. Tolerating his medication well. He wants to get B 12 deficiency checked today. He reports he feel tired after coming back from work in the evening. Associated symptoms are depression, leg pain and fatigue Denies exercising. He is concerned that it could be due to his vitamin.  He is interesting to get flu shot today. He is asking to see a urologist today for fertility as well. He wants to quit smoking and asking for the help related to this issue. He denies having congestion, cough, chest pain, palpations, N/V/D, frequency, muscle pain, and joint pain.    Past Medical History:  Diagnosis Date   History of hemorrhoids    history of since child   Other specified disorders of liver    Tonsillitis    Urinary dysfunction    per [facility]    Past Surgical History:  Procedure Laterality Date   NO PAST SURGERIES      Family History  Problem Relation Age of Onset   Hypertension Mother    Kidney disease Paternal Uncle     Social History   Socioeconomic History   Marital status: Married    Spouse name: Not on file   Number of children: 0   Years of education: Not on file   Highest education level: Not on file  Occupational History   Occupation: Health and safety inspector  Tobacco Use   Smoking status: Every Day    Packs/day: 0.50    Years: 10.00    Pack years: 5.00    Types: Cigarettes   Smokeless tobacco: Never  Substance and Sexual  Activity   Alcohol use: No    Alcohol/week: 4.0 standard drinks    Types: 4 Cans of beer per week    Comment: rarely    Drug use: No   Sexual activity: Not Currently    Partners: Female  Other Topics Concern   Not on file  Social History Narrative   Single, no children   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     levocetirizine (XYZAL) 5 MG tablet Take by mouth.     No facility-administered medications prior to visit.    Allergies  Allergen Reactions   Bactrim [Sulfamethoxazole-Trimethoprim] Other (See Comments) and Itching    Skin reactions.  Skin reactions.     Review of Systems  Constitutional:  Positive for malaise/fatigue.  HENT:  Negative for congestion.   Respiratory:  Negative for cough.   Cardiovascular:  Negative for chest pain and palpitations.  Gastrointestinal:  Negative for diarrhea, nausea and vomiting.  Genitourinary:  Negative for frequency.  Musculoskeletal:  Positive for myalgias (Leg pain). Negative for joint pain.  Psychiatric/Behavioral:  Positive for depression. Negative for suicidal ideas.       Objective:    Physical Exam Constitutional:      General: He is not in acute distress.    Appearance: Normal appearance. He is not ill-appearing.  HENT:     Head: Normocephalic and atraumatic.     Right Ear: Tympanic membrane, ear canal and external ear normal.     Left Ear: Tympanic membrane, ear canal and external ear normal.  Eyes:     Extraocular Movements: Extraocular  movements intact.     Pupils: Pupils are equal, round, and reactive to light.  Cardiovascular:     Rate and Rhythm: Normal rate and regular rhythm.     Heart sounds: Normal heart sounds. No murmur heard.   No gallop.  Pulmonary:     Effort: Pulmonary effort is normal. No respiratory distress.     Breath sounds: Normal breath sounds. No wheezing or rales.  Abdominal:     General: There is no distension.     Tenderness: There is no abdominal tenderness. There is no guarding.  Lymphadenopathy:     Cervical: No cervical adenopathy.  Skin:    General: Skin is warm and dry.  Neurological:     Mental Status: He is alert and oriented to person, place, and time.  Psychiatric:        Behavior: Behavior normal.        Judgment: Judgment normal.    BP 98/70 (BP Location: Left Arm, Patient Position: Sitting, Cuff Size: Normal)   Pulse 84   Temp 98.9 F (37.2 C) (Oral)   Resp 18   Ht '5\' 5"'$  (1.651 m)   Wt 142 lb (64.4 kg)   SpO2 98%   BMI 23.63 kg/m  Wt Readings from Last 3 Encounters:  11/12/20 142 lb (64.4 kg)  02/01/19 149 lb 6.4 oz (67.8 kg)  12/28/17 142 lb (64.4 kg)    Diabetic Foot Exam - Simple   No data filed    Lab Results  Component Value Date   WBC 7.2 02/01/2019   HGB 16.3 02/01/2019   HCT 46.9 02/01/2019   PLT 244 02/01/2019   GLUCOSE 106 (H) 02/01/2019   CHOL 208 (H) 02/01/2019   TRIG 219 (H) 02/01/2019   HDL 47 02/01/2019   LDLDIRECT 157.0 12/26/2017   LDLCALC 125 (H) 02/01/2019   ALT 42 02/01/2019   AST 23 02/01/2019   NA 139 02/01/2019   K 3.8 02/01/2019   CL 102 02/01/2019   CREATININE 1.17 02/01/2019   BUN 11 02/01/2019   CO2 26 02/01/2019   TSH 0.80 02/01/2019   PSA 1.38 02/11/2015   HGBA1C 5.7 12/28/2017    Lab Results  Component Value Date   TSH 0.80 02/01/2019   Lab Results  Component Value Date   WBC 7.2 02/01/2019   HGB 16.3 02/01/2019   HCT 46.9 02/01/2019   MCV 89.0 02/01/2019   PLT 244 02/01/2019   Lab Results  Component  Value Date   NA 139 02/01/2019   K 3.8 02/01/2019   CO2 26 02/01/2019   GLUCOSE 106 (H) 02/01/2019   BUN 11 02/01/2019  CREATININE 1.17 02/01/2019   BILITOT 0.4 02/01/2019   ALKPHOS 55 12/26/2017   AST 23 02/01/2019   ALT 42 02/01/2019   PROT 7.1 02/01/2019   ALBUMIN 4.7 12/26/2017   CALCIUM 9.7 02/01/2019   GFR 95.17 12/26/2017   Lab Results  Component Value Date   CHOL 208 (H) 02/01/2019   Lab Results  Component Value Date   HDL 47 02/01/2019   Lab Results  Component Value Date   LDLCALC 125 (H) 02/01/2019   Lab Results  Component Value Date   TRIG 219 (H) 02/01/2019   Lab Results  Component Value Date   CHOLHDL 4.4 02/01/2019   Lab Results  Component Value Date   HGBA1C 5.7 12/28/2017       Assessment & Plan:   Problem List Items Addressed This Visit       Unprioritized   Fertility testing    Refer to urology at pt request       Relevant Orders   Ambulatory referral to Urology   Hyperglycemia   Relevant Orders   Hemoglobin A1c   Other fatigue    Check labs       Relevant Orders   Vitamin B12   VITAMIN D 25 Hydroxy (Vit-D Deficiency, Fractures)   Preventative health care - Primary    ghm utd Check labs  See avs  Flu shot given      Relevant Orders   Lipid panel   CBC with Differential/Platelet   Comprehensive metabolic panel   TSH   Other Visit Diagnoses     Need for hepatitis C screening test       Relevant Orders   Hepatitis C antibody   Need for influenza vaccination       Relevant Orders   Flu Vaccine QUAD 20moIM (Fluarix, Fluzone & Alfiuria Quad PF) (Completed)         No orders of the defined types were placed in this encounter.   I,Ann Held DO, personally preformed the services described in this documentation.  All medical record entries made by the scribe were at my direction and in my presence.  I have reviewed the chart and discharge instructions (if applicable) and agree that the record reflects  my personal performance and is accurate and complete. 11/12/2020.   I,Tyler [name],acting as a sEducation administratorfor [employer] DO.,have documented all relevant documentation on the behalf of YAnn Held DO,as directed by  YAnn Held DO while in the presence of YAnn Held DO.     YAnn Held DO

## 2020-11-12 NOTE — Assessment & Plan Note (Signed)
Check labs 

## 2020-11-12 NOTE — Assessment & Plan Note (Signed)
Refer to urology at pt request

## 2020-11-12 NOTE — Patient Instructions (Signed)
Preventive Care 35-35 Years Old, Male Preventive care refers to lifestyle choices and visits with your health care provider that can promote health and wellness. This includes: A yearly physical exam. This is also called an annual wellness visit. Regular dental and eye exams. Immunizations. Screening for certain conditions. Healthy lifestyle choices, such as: Eating a healthy diet. Getting regular exercise. Not using drugs or products that contain nicotine and tobacco. Limiting alcohol use. What can I expect for my preventive care visit? Physical exam Your health care provider may check your: Height and weight. These may be used to calculate your BMI (body mass index). BMI is a measurement that tells if you are at a healthy weight. Heart rate and blood pressure. Body temperature. Skin for abnormal spots. Counseling Your health care provider may ask you questions about your: Past medical problems. Family's medical history. Alcohol, tobacco, and drug use. Emotional well-being. Home life and relationship well-being. Sexual activity. Diet, exercise, and sleep habits. Work and work Statistician. Access to firearms. What immunizations do I need? Vaccines are usually given at various ages, according to a schedule. Your health care provider will recommend vaccines for you based on your age, medical history, and lifestyle or other factors, such as travel or where you work. What tests do I need? Blood tests Lipid and cholesterol levels. These may be checked every 5 years starting at age 61. Hepatitis C test. Hepatitis B test. Screening  Diabetes screening. This is done by checking your blood sugar (glucose) after you have not eaten for a while (fasting). Genital exam to check for testicular cancer or hernias. STD (sexually transmitted disease) testing, if you are at risk. Talk with your health care provider about your test results, treatment options, and if necessary, the need for more  tests. Follow these instructions at home: Eating and drinking  Eat a healthy diet that includes fresh fruits and vegetables, whole grains, lean protein, and low-fat dairy products. Drink enough fluid to keep your urine pale yellow. Take vitamin and mineral supplements as recommended by your health care provider. Do not drink alcohol if your health care provider tells you not to drink. If you drink alcohol: Limit how much you have to 0-2 drinks a day. Be aware of how much alcohol is in your drink. In the [place] one drink equals one 12 oz bottle of beer (355 mL), one 5 oz glass of wine (148 mL), or one 1 oz glass of hard liquor (44 mL). Lifestyle Take daily care of your teeth and gums. Brush your teeth every morning and night with fluoride toothpaste. Floss one time each day. Stay active. Exercise for at least 30 minutes 5 or more days each week. Do not use any products that contain nicotine or tobacco, such as cigarettes, e-cigarettes, and chewing tobacco. If you need help quitting, ask your health care provider. Do not use drugs. If you are sexually active, practice safe sex. Use a condom or other form of protection to prevent STIs (sexually transmitted infections). Find healthy ways to cope with stress, such as: Meditation, yoga, or listening to music. Journaling. Talking to a trusted person. Spending time with friends and family. Safety Always wear your seat belt while driving or riding in a vehicle. Do not drive: If you have been drinking alcohol. Do not ride with someone who has been drinking. When you are tired or distracted. While texting. Wear a helmet and other protective equipment during sports activities. If you have firearms in your house, make sure  you follow all gun safety procedures. Seek help if you have been physically or sexually abused. What's next? Go to your health care provider once a year for an annual wellness visit. Ask your health care provider how often you  should have your eyes and teeth checked. Stay up to date on all vaccines. This information is not intended to replace advice given to you by your health care provider. Make sure you discuss any questions you have with your health care provider. Document Revised: 04/24/2020 Document Reviewed: 02/08/2018 Elsevier Patient Education  2022 [employer].

## 2020-11-12 NOTE — Assessment & Plan Note (Signed)
ghm utd Check labs  See avs  Flu shot given

## 2020-11-13 LAB — HEPATITIS C ANTIBODY
Hepatitis C Ab: NONREACTIVE
SIGNAL TO CUT-OFF: 0.01 (ref <1.00)

## 2020-11-18 ENCOUNTER — Other Ambulatory Visit: Payer: Self-pay

## 2020-11-18 MED ORDER — VITAMIN D (ERGOCALCIFEROL) 1.25 MG (50000 UNIT) PO CAPS: 50000.0000 [IU] | ORAL_CAPSULE | ORAL | 1 refills | Status: DC

## 2020-11-19 ENCOUNTER — Telehealth: Payer: Self-pay

## 2020-11-19 NOTE — Telephone Encounter (Signed)
Pt called back regarding labs from 11/12/20. Informed him of results and recommendations. Pt verbalized understanding. Offered to schedule him a lab appt in 3 months- states he would call back to schedule later.

## 2020-12-02 ENCOUNTER — Encounter: Payer: Self-pay | Admitting: Family Medicine

## 2020-12-04 ENCOUNTER — Ambulatory Visit (INDEPENDENT_AMBULATORY_CARE_PROVIDER_SITE_OTHER): Payer: Managed Care, Other (non HMO) | Admitting: Family Medicine

## 2020-12-04 ENCOUNTER — Encounter: Payer: Self-pay | Admitting: Family Medicine

## 2020-12-04 ENCOUNTER — Other Ambulatory Visit: Payer: Self-pay

## 2020-12-04 VITALS — BP 100/80 | HR 84 | Temp 98.6°F | Resp 18 | Ht 65.0 in | Wt 144.2 lb

## 2020-12-04 DIAGNOSIS — G4452 New daily persistent headache (NDPH): Secondary | ICD-10-CM

## 2020-12-04 DIAGNOSIS — E559 Vitamin D deficiency, unspecified: Secondary | ICD-10-CM | POA: Diagnosis not present

## 2020-12-04 DIAGNOSIS — M545 Low back pain, unspecified: Secondary | ICD-10-CM | POA: Diagnosis not present

## 2020-12-04 LAB — POC URINALSYSI DIPSTICK (AUTOMATED)
Bilirubin, UA: NEGATIVE
Blood, UA: NEGATIVE
Glucose, UA: NEGATIVE
Ketones, UA: NEGATIVE
Leukocytes, UA: NEGATIVE
Nitrite, UA: NEGATIVE
Protein, UA: NEGATIVE
Spec Grav, UA: <=1.005 — AB (ref 1.010–1.025)
Urobilinogen, UA: 0.2 E.U./dL
pH, UA: 6 (ref 5.0–8.0)

## 2020-12-04 LAB — CBC WITH DIFFERENTIAL/PLATELET
Basophils Absolute: 0 10*3/uL (ref 0.0–0.1)
Basophils Relative: 0.6 % (ref 0.0–3.0)
Eosinophils Absolute: 0.2 10*3/uL (ref 0.0–0.7)
Eosinophils Relative: 2.2 % (ref 0.0–5.0)
HCT: 44.1 % (ref 39.0–52.0)
Hemoglobin: 15 g/dL (ref 13.0–17.0)
Lymphocytes Relative: 22.7 % (ref 12.0–46.0)
Lymphs Abs: 1.8 10*3/uL (ref 0.7–4.0)
MCHC: 33.9 g/dL (ref 30.0–36.0)
MCV: 91.3 fl (ref 78.0–100.0)
Monocytes Absolute: 0.6 10*3/uL (ref 0.1–1.0)
Monocytes Relative: 7.2 % (ref 3.0–12.0)
Neutro Abs: 5.3 10*3/uL (ref 1.4–7.7)
Neutrophils Relative %: 67.3 % (ref 43.0–77.0)
Platelets: 257 10*3/uL (ref 150.0–400.0)
RBC: 4.82 Mil/uL (ref 4.22–5.81)
RDW: 12.4 % (ref 11.5–15.5)
WBC: 7.9 10*3/uL (ref 4.0–10.5)

## 2020-12-04 LAB — COMPREHENSIVE METABOLIC PANEL
ALT: 23 U/L (ref 0–53)
AST: 15 U/L (ref 0–37)
Albumin: 4.5 g/dL (ref 3.5–5.2)
Alkaline Phosphatase: 67 U/L (ref 39–117)
BUN: 5 mg/dL — ABNORMAL LOW (ref 6–23)
CO2: 29 mEq/L (ref 19–32)
Calcium: 9.8 mg/dL (ref 8.4–10.5)
Chloride: 102 mEq/L (ref 96–112)
Creatinine, Ser: 0.88 mg/dL (ref 0.40–1.50)
GFR: 111.64 mL/min (ref >60.00)
Glucose, Bld: 117 mg/dL — ABNORMAL HIGH (ref 70–99)
Potassium: 3.9 mEq/L (ref 3.5–5.1)
Sodium: 139 mEq/L (ref 135–145)
Total Bilirubin: 0.6 mg/dL (ref 0.2–1.2)
Total Protein: 7.5 g/dL (ref 6.0–8.3)

## 2020-12-04 LAB — VITAMIN D 25 HYDROXY (VIT D DEFICIENCY, FRACTURES): VITD: 20.74 ng/mL — ABNORMAL LOW (ref 30.00–100.00)

## 2020-12-04 LAB — SEDIMENTATION RATE: Sed Rate: 27 mm/hr — ABNORMAL HIGH (ref 0–15)

## 2020-12-04 MED ORDER — MELOXICAM 15 MG PO TABS: 15.0000 mg | ORAL_TABLET | Freq: Every day | ORAL | 1 refills | Status: DC

## 2020-12-04 NOTE — Assessment & Plan Note (Signed)
Check esr mobic  F/u if no improvement Ha started monday

## 2020-12-04 NOTE — Assessment & Plan Note (Signed)
mobic daily Pain started yesterday Heat/ ice rto if pain con't

## 2020-12-04 NOTE — Patient Instructions (Signed)
General Headache Without Cause A headache is pain or discomfort that is felt around the head or neck area. There are many causes and types of headaches. In some cases, the cause may not be found. Follow these instructions at home: Watch your condition for any changes. Let your doctor know about them. Take these steps to help with your condition: Managing pain   Take over-the-counter and prescription medicines only as told by your doctor. Lie down in a dark, quiet room when you have a headache. If told, put ice on your head and neck area: Put ice in a plastic bag. Place a towel between your skin and the bag. Leave the ice on for 20 minutes, 2-3 times per day. If told, put heat on the affected area. Use the heat source that your doctor recommends, such as a moist heat pack or a heating pad. Place a towel between your skin and the heat source. Leave the heat on for 20-30 minutes. Remove the heat if your skin turns bright red. This is very important if you are unable to feel pain, heat, or cold. You may have a greater risk of getting burned. Keep lights dim if bright lights bother you or make your headaches worse. Eating and drinking Eat meals on a regular schedule. If you drink alcohol: Limit how much you use to: 0-1 drink a day for women. 0-2 drinks a day for men. Be aware of how much alcohol is in your drink. In the [place] one drink equals one 12 oz bottle of beer (355 mL), one 5 oz glass of wine (148 mL), or one 1 oz glass of hard liquor (44 mL). Stop drinking caffeine, or reduce how much caffeine you drink. General instructions  Keep a journal to find out if certain things bring on headaches. For example, write down: What you eat and drink. How much sleep you get. Any change to your diet or medicines. Get a massage or try other ways to relax. Limit stress. Sit up straight. Do not tighten (tense) your muscles. Do not use any products that contain nicotine or tobacco. This includes  cigarettes, e-cigarettes, and chewing tobacco. If you need help quitting, ask your doctor. Exercise regularly as told by your doctor. Get enough sleep. This often means 7-9 hours of sleep each night. Keep all follow-up visits as told by your doctor. This is important. Contact a doctor if: Your symptoms are not helped by medicine. You have a headache that feels different than the other headaches. You feel sick to your stomach (nauseous) or you throw up (vomit). You have a fever. Get help right away if: Your headache gets very bad quickly. Your headache gets worse after a lot of physical activity. You keep throwing up. You have a stiff neck. You have trouble seeing. You have trouble speaking. You have pain in the eye or ear. Your muscles are weak or you lose muscle control. You lose your balance or have trouble walking. You feel like you will pass out (faint) or you pass out. You are mixed up (confused). You have a seizure. Summary A headache is pain or discomfort that is felt around the head or neck area. There are many causes and types of headaches. In some cases, the cause may not be found. Keep a journal to help find out what causes your headaches. Watch your condition for any changes. Let your doctor know about them. Contact a doctor if you have a headache that is different from usual, or   if your headache is not helped by medicine. Get help right away if your headache gets very bad, you throw up, you have trouble seeing, you lose your balance, or you have a seizure. This information is not intended to replace advice given to you by your health care provider. Make sure you discuss any questions you have with your health care provider. Document Revised: 09/04/2017 Document Reviewed: 09/04/2017 Elsevier Patient Education  2022 Elsevier Inc.  

## 2020-12-04 NOTE — Progress Notes (Signed)
Subjective:   By signing my name below, I, Zite Okoli, attest that this documentation has been prepared under the direction and in the presence of Carollee Herter, Kendrick Fries R DO. 12/04/2020   Patient ID: Tyler [name], male    DOB: 23-Dec-1985, 35 y.o.   MRN: [id number]  Chief Complaint  Patient presents with   Headache    Pt states having headache for 3-4 days, Pt states headache over the left eye. Pt states no blurred vision or dizziness.     HPI Patient is in today for an office visit.  He reports that from [place], he started having severe headaches within an hour of waking up. The headache has been located to the center of his head and reduces as the day goes by. He used Advil but it provided little relief. From Thursday, he mentions that the pain has not been as severe but the head hurts when palpated. He denies congestion, nausea, vomiting and vision changes. He has not been stressed recently and is still sleeping regularly. He mentions that his mother used to get regular headaches from her 30's-40's.  He reports right lower back pain that hurts when palpated.   Past Medical History:  Diagnosis Date   History of hemorrhoids    history of since child   Other specified disorders of liver    Tonsillitis    Urinary dysfunction    [facility]    Past Surgical History:  Procedure Laterality Date   NO PAST SURGERIES      Family History  Problem Relation Age of Onset   Hypertension Mother    Kidney disease Paternal Uncle     Social History   Socioeconomic History   Marital status: Married    Spouse name: Not on file   Number of children: 0   Years of education: Not on file   Highest education level: Not on file  Occupational History   Occupation: Health and safety inspector  Tobacco Use   Smoking status: Every Day    Packs/day: 0.50    Years: 10.00    Pack years: 5.00    Types: Cigarettes   Smokeless tobacco: Never  Substance and Sexual Activity   Alcohol  use: No    Alcohol/week: 4.0 standard drinks    Types: 4 Cans of beer per week    Comment: rarely    Drug use: No   Sexual activity: Not Currently    Partners: Female  Other Topics Concern   Not on file  Social History Narrative   Single, no children   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     levocetirizine (XYZAL) 5 MG tablet Take by mouth.     Vitamin D, Ergocalciferol, (DRISDOL) 1.25 MG (50000 UNIT) CAPS capsule Take 1 capsule (50,000 Units total) by mouth every 7 (seven) days. 12 capsule 1   No facility-administered medications prior to visit.    Allergies  Allergen Reactions   Bactrim [  Sulfamethoxazole-Trimethoprim] Other (See Comments) and Itching    Skin reactions.  Skin reactions.     Review of Systems  Constitutional:  Negative for fever.  HENT:  Negative for congestion, ear pain, hearing loss, sinus pain and sore throat.   Eyes:  Negative for blurred vision and pain.  Respiratory:  Negative for cough, sputum production, shortness of breath and wheezing.   Cardiovascular:  Negative for chest pain and palpitations.  Gastrointestinal:  Negative for blood in stool, constipation, diarrhea, nausea and vomiting.  Genitourinary:  Negative for dysuria, frequency, hematuria and urgency.  Musculoskeletal:  Positive for back pain (right lower). Negative for falls and myalgias.  Neurological:  Positive for headaches. Negative for dizziness, sensory change, loss of consciousness and weakness.   Endo/Heme/Allergies:  Negative for environmental allergies. Does not bruise/bleed easily.  Psychiatric/Behavioral:  Negative for depression and suicidal ideas. The patient is not nervous/anxious and does not have insomnia.       Objective:    Physical Exam Constitutional:      General: He is not in acute distress.    Appearance: Normal appearance. He is not ill-appearing.  HENT:     Head: Normocephalic and atraumatic.     Comments: Head tender when palpated    Right Ear: Tympanic membrane, ear canal and external ear normal.     Left Ear: Tympanic membrane, ear canal and external ear normal.  Eyes:     Pupils: Pupils are equal, round, and reactive to light.  Cardiovascular:     Rate and Rhythm: Normal rate and regular rhythm.     Pulses: Normal pulses.     Heart sounds: No murmur heard.   No gallop.  Pulmonary:     Effort: Pulmonary effort is normal. No respiratory distress.     Breath sounds: Normal breath sounds. No wheezing or rhonchi.  Abdominal:     General: Bowel sounds are normal. There is no distension.     Palpations: Abdomen is soft.     Tenderness: There is no abdominal tenderness. There is no guarding.     Hernia: No hernia is present.  Musculoskeletal:     Cervical back: Neck supple.     Lumbar back: Tenderness present. Positive right straight leg raise test.     Comments: Positive slr on right leg but full rom with pain Back tender when palpated   Lymphadenopathy:     Cervical: No cervical adenopathy.  Skin:    General: Skin is warm and dry.  Neurological:     Mental Status: He is alert and oriented to person, place, and time.    BP 100/80 (BP Location: Left Arm, Patient Position: Sitting, Cuff Size: Normal)   Pulse 84   Temp 98.6 F (37 C) (Oral)   Resp 18   Ht 5' 5"  (1.651 m)   Wt 144 lb 3.2 oz (65.4 kg)   SpO2 97%   BMI 24.00 kg/m  Wt Readings from Last 3 Encounters:  12/04/20 144 lb 3.2 oz (65.4 kg)  11/12/20 142 lb (64.4 kg)  02/01/19 149 lb  6.4 oz (67.8 kg)    Diabetic Foot Exam - Simple   No data filed    Lab Results  Component Value Date   WBC 7.6 11/12/2020   HGB 16.4 11/12/2020   HCT 47.9 11/12/2020   PLT 229.0 11/12/2020   GLUCOSE 97 11/12/2020   CHOL 248 (H) 11/12/2020   TRIG 204.0 (H) 11/12/2020   HDL 54.50 11/12/2020   LDLDIRECT 177.0 11/12/2020  LDLCALC 125 (H) 02/01/2019   ALT 37 11/12/2020   AST 21 11/12/2020   NA 139 11/12/2020   K 4.4 11/12/2020   CL 101 11/12/2020   CREATININE 0.96 11/12/2020   BUN 7 11/12/2020   CO2 31 11/12/2020   TSH 1.59 11/12/2020   PSA 1.38 02/11/2015   HGBA1C 5.9 11/12/2020    Lab Results  Component Value Date   TSH 1.59 11/12/2020   Lab Results  Component Value Date   WBC 7.6 11/12/2020   HGB 16.4 11/12/2020   HCT 47.9 11/12/2020   MCV 91.8 11/12/2020   PLT 229.0 11/12/2020   Lab Results  Component Value Date   NA 139 11/12/2020   K 4.4 11/12/2020   CO2 31 11/12/2020   GLUCOSE 97 11/12/2020   BUN 7 11/12/2020   CREATININE 0.96 11/12/2020   BILITOT 0.8 11/12/2020   ALKPHOS 70 11/12/2020   AST 21 11/12/2020   ALT 37 11/12/2020   PROT 7.7 11/12/2020   ALBUMIN 4.7 11/12/2020   CALCIUM 9.9 11/12/2020   GFR 102.67 11/12/2020   Lab Results  Component Value Date   CHOL 248 (H) 11/12/2020   Lab Results  Component Value Date   HDL 54.50 11/12/2020   Lab Results  Component Value Date   LDLCALC 125 (H) 02/01/2019   Lab Results  Component Value Date   TRIG 204.0 (H) 11/12/2020   Lab Results  Component Value Date   CHOLHDL 5 11/12/2020   Lab Results  Component Value Date   HGBA1C 5.9 11/12/2020       Assessment & Plan:   Problem List Items Addressed This Visit       Unprioritized   Acute right-sided low back pain without sciatica - Primary    mobic daily Pain started yesterday Heat/ ice rto if pain con't       Relevant Medications   meloxicam (MOBIC) 15 MG tablet   Other Relevant Orders   VITAMIN D 25 Hydroxy (Vit-D  Deficiency, Fractures)   CBC with Differential/Platelet   Comprehensive metabolic panel   Sedimentation rate   POCT Urinalysis Dipstick (Automated)   New daily persistent headache    Check esr mobic  F/u if no improvement Ha started monday      Relevant Medications   meloxicam (MOBIC) 15 MG tablet   Other Relevant Orders   Sedimentation rate   Other Visit Diagnoses     Vitamin D deficiency       Relevant Orders   VITAMIN D 25 Hydroxy (Vit-D Deficiency, Fractures)       Meds ordered this encounter  Medications   meloxicam (MOBIC) 15 MG tablet    Sig: Take 1 tablet (15 mg total) by mouth daily.    Dispense:  30 tablet    Refill:  1    I,Zite Okoli,acting as a Education administrator for [employer], DO.,have documented all relevant documentation on the behalf of Ann Held, DO,as directed by  Ann Held, DO while in the presence of Ann Held, DO.   I, Ann Held DO., personally preformed the services described in this documentation.  All medical record entries made by the scribe were at my direction and in my presence.  I have reviewed the chart and discharge instructions (if applicable) and agree that the record reflects my personal performance and is accurate and complete. 12/04/2020

## 2021-01-14 ENCOUNTER — Telehealth: Payer: Self-pay | Admitting: Family Medicine

## 2021-01-14 NOTE — Telephone Encounter (Signed)
If patient's wife is pregnant she will need a OBGYN.

## 2021-01-14 NOTE — Telephone Encounter (Signed)
Pt stated wife is pregnant and does not have any drs and was wondering if Dr. Etter Sjogren would accept her a new pt, please advise.

## 2021-01-15 NOTE — Telephone Encounter (Signed)
I did mention she would need an ob as well but I think he was also looking for her to have a primary.

## 2021-01-15 NOTE — Telephone Encounter (Signed)
Wife schedule on 29th

## 2021-05-26 ENCOUNTER — Ambulatory Visit (INDEPENDENT_AMBULATORY_CARE_PROVIDER_SITE_OTHER): Payer: Managed Care, Other (non HMO) | Admitting: *Deleted

## 2021-05-26 DIAGNOSIS — Z23 Encounter for immunization: Secondary | ICD-10-CM | POA: Diagnosis not present

## 2021-05-26 NOTE — Progress Notes (Signed)
Patient here for flu vaccine per standing order. ? ?Vaccine given in left deltoid and patient tolerated well. ?

## 2021-05-26 NOTE — Progress Notes (Deleted)
Tyler [name] is a 36 y.o. male presents to the office today for a flu vaccine. ? ?Vaccine given in deltoid. Patient tolerated injection well. ? ? ?Raynelle Bring Frierson-Sandells , CMA  ?

## 2021-08-02 ENCOUNTER — Encounter: Payer: Self-pay | Admitting: Family Medicine

## 2021-08-02 ENCOUNTER — Ambulatory Visit (INDEPENDENT_AMBULATORY_CARE_PROVIDER_SITE_OTHER): Payer: Managed Care, Other (non HMO) | Admitting: Family Medicine

## 2021-08-02 VITALS — BP 110/70 | HR 98 | Temp 98.2°F | Resp 18 | Ht 65.0 in | Wt 138.0 lb

## 2021-08-02 DIAGNOSIS — E538 Deficiency of other specified B group vitamins: Secondary | ICD-10-CM

## 2021-08-02 DIAGNOSIS — H1033 Unspecified acute conjunctivitis, bilateral: Secondary | ICD-10-CM

## 2021-08-02 DIAGNOSIS — M791 Myalgia, unspecified site: Secondary | ICD-10-CM

## 2021-08-02 DIAGNOSIS — M2550 Pain in unspecified joint: Secondary | ICD-10-CM

## 2021-08-02 MED ORDER — MOXIFLOXACIN HCL 0.5 % OP SOLN: 1.0000 [drp] | Freq: Three times a day (TID) | OPHTHALMIC | 0 refills | Status: DC

## 2021-08-02 NOTE — Progress Notes (Signed)
Subjective:   By signing my name below, I, Tyler [name], attest that this documentation has been prepared under the direction and in the presence of Tyler Schanz DO, 08/02/2021     Patient ID: Tyler [name], male    DOB: 27-Jul-1985, 36 y.o.   MRN: [id number]  Chief Complaint  Patient presents with   Fatigue    Redness of eyes, and frequent fevers and chills, fatigue , body aches mostly legs at joints for 3 weeks Pt seen at [facility] twice and tested for COVID/FLU/ Strep was negative both times and seen a virtual doctor. Pt states having lots of joint pain in legs and fatigue.    HPI Patient is in today for an office visit.  He complains of frequent fever, chills, headaches, eye redness, muscle aches and joint pains in his knees. He also has eye discharge in the morning. He states that symptoms have been going on for the past two months. He uses OTC eye drops which helps relieve his eye symptoms. He states that his vision is fine. His joint hurts when he is walking and continues to be in pain when his muscles are relaxing.  He denies of any coughing or cold. He has been tested for flu, Covid and strep throat and all tests came back negative. He only takes the occasional Advil. He denies of any family history of Lupus or arthritis. He wants to be checked for vitamin deficiencies    Past Medical History:  Diagnosis Date   History of hemorrhoids    history of since child   Other specified disorders of liver    Tonsillitis    Urinary dysfunction    [facility]    Past Surgical History:  Procedure Laterality Date   NO PAST SURGERIES      Family History  Problem Relation Age of Onset   Hypertension Mother    Kidney disease Paternal Uncle     Social History   Socioeconomic History   Marital status: Married    Spouse name: Not on file   Number of children: 0   Years of education: Not on file   Highest education level: Not on file  Occupational History    Occupation: Health and safety inspector  Tobacco Use   Smoking status: Every Day    Packs/day: 0.50    Years: 10.00    Total pack years: 5.00    Types: Cigarettes   Smokeless tobacco: Never  Substance and Sexual Activity   Alcohol use: No    Alcohol/week: 4.0 standard drinks of alcohol    Types: 4 Cans of beer per week    Comment: rarely    Drug use: No   Sexual activity: Not Currently    Partners: Female  Other Topics Concern   Not on file  Social History Narrative   Single, no children   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 (Patient not taking: Reported on 08/02/2021)  levocetirizine (XYZAL) 5 MG tablet Take by mouth. (Patient not taking: Reported on 08/02/2021)     meloxicam (MOBIC) 15 MG tablet Take 1 tablet (15 mg total) by mouth daily. (Patient not taking: Reported on 08/02/2021) 30 tablet 1   Vitamin D, Ergocalciferol, (DRISDOL) 1.25 MG (50000 UNIT) CAPS capsule Take 1 capsule (50,000 Units total) by mouth every 7 (seven) days. (Patient not taking: Reported on 08/02/2021) 12 capsule 1   No facility-administered medications prior to visit.    Allergies  Allergen Reactions   Bactrim [Sulfamethoxazole-Trimethoprim] Other (See Comments) and Itching    Skin reactions.  Skin reactions.     Review of Systems  Constitutional:  Positive for chills and fever.       (-) Cold  Eyes:  Positive for discharge and redness.  Respiratory:  Negative for cough.   Musculoskeletal:  Positive for joint pain (Bilateral Knee) and  myalgias.  Neurological:  Positive for headaches.       Objective:    Physical Exam Constitutional:      General: He is not in acute distress.    Appearance: Normal appearance. He is not ill-appearing.  HENT:     Head: Normocephalic and atraumatic.     Right Ear: External ear normal.     Left Ear: External ear normal.  Eyes:     Extraocular Movements: Extraocular movements intact.     Pupils: Pupils are equal, round, and reactive to light.  Cardiovascular:     Rate and Rhythm: Normal rate and regular rhythm.     Heart sounds: Normal heart sounds. No murmur heard.    No gallop.  Pulmonary:     Effort: Pulmonary effort is normal. No respiratory distress.     Breath sounds: Normal breath sounds. No wheezing or rales.  Skin:    General: Skin is warm and dry.  Neurological:     Mental Status: He is alert and oriented to person, place, and time.  Psychiatric:        Judgment: Judgment normal.     BP 110/70 (BP Location: Left Arm, Patient Position: Sitting, Cuff Size: Normal)   Pulse 98   Temp 98.2 F (36.8 C) (Oral)   Resp 18   Ht '5\' 5"'$  (1.651 m)   Wt 138 lb (62.6 kg)   SpO2 97%   BMI 22.96 kg/m  Wt Readings from Last 3 Encounters:  08/02/21 138 lb (62.6 kg)  12/04/20 144 lb 3.2 oz (65.4 kg)  11/12/20 142 lb (64.4 kg)    Diabetic Foot Exam - Simple   No data filed    Lab Results  Component Value Date   WBC 6.3 08/02/2021   HGB 13.9 08/02/2021   HCT 40.8 08/02/2021   PLT 262.0 08/02/2021   GLUCOSE 88 08/02/2021   CHOL 248 (H) 11/12/2020   TRIG 204.0 (H) 11/12/2020   HDL 54.50 11/12/2020   LDLDIRECT 177.0 11/12/2020   LDLCALC 125 (H) 02/01/2019   ALT 26 08/02/2021   AST 20 08/02/2021   NA 138 08/02/2021   K 4.0 08/02/2021   CL 101 08/02/2021   CREATININE 0.95 08/02/2021   BUN 13 08/02/2021   CO2 28 08/02/2021   TSH 2.14 08/02/2021   PSA 1.38 02/11/2015   HGBA1C 5.9 11/12/2020    Lab Results  Component Value Date   TSH 2.14 08/02/2021   Lab  Results  Component Value Date   WBC 6.3 08/02/2021   HGB 13.9 08/02/2021   HCT 40.8 08/02/2021  MCV 87.8 08/02/2021   PLT 262.0 08/02/2021   Lab Results  Component Value Date   NA 138 08/02/2021   K 4.0 08/02/2021   CO2 28 08/02/2021   GLUCOSE 88 08/02/2021   BUN 13 08/02/2021   CREATININE 0.95 08/02/2021   BILITOT 0.5 08/02/2021   ALKPHOS 64 08/02/2021   AST 20 08/02/2021   ALT 26 08/02/2021   PROT 7.4 08/02/2021   ALBUMIN 4.2 08/02/2021   CALCIUM 9.7 08/02/2021   GFR 103.44 08/02/2021   Lab Results  Component Value Date   CHOL 248 (H) 11/12/2020   Lab Results  Component Value Date   HDL 54.50 11/12/2020   Lab Results  Component Value Date   LDLCALC 125 (H) 02/01/2019   Lab Results  Component Value Date   TRIG 204.0 (H) 11/12/2020   Lab Results  Component Value Date   CHOLHDL 5 11/12/2020   Lab Results  Component Value Date   HGBA1C 5.9 11/12/2020       Assessment & Plan:   Problem List Items Addressed This Visit   None Visit Diagnoses     Myalgia    -  Primary   Relevant Orders   CBC with Differential/Platelet (Completed)   Comprehensive metabolic panel (Completed)   TSH (Completed)   VITAMIN D 25 Hydroxy (Vit-D Deficiency, Fractures) (Completed)   Antinuclear Antib (ANA) (Completed)   Sedimentation rate (Completed)   Rheumatoid Factor (Completed)   Vitamin B12 (Completed)   TSH   Pain in joint, multiple sites       Relevant Orders   CBC with Differential/Platelet (Completed)   Comprehensive metabolic panel (Completed)   TSH (Completed)   VITAMIN D 25 Hydroxy (Vit-D Deficiency, Fractures) (Completed)   Antinuclear Antib (ANA) (Completed)   Sedimentation rate (Completed)   Rheumatoid Factor (Completed)   Vitamin B12 (Completed)   TSH   B12 deficiency       Acute conjunctivitis of both eyes, unspecified acute conjunctivitis type       Relevant Medications   moxifloxacin (VIGAMOX) 0.5 % ophthalmic solution       Meds ordered this  encounter  Medications   moxifloxacin (VIGAMOX) 0.5 % ophthalmic solution    Sig: Place 1 drop into both eyes 3 (three) times daily.    Dispense:  3 mL    Refill:  0    I, Ann Held, DO, personally preformed the services described in this documentation.  All medical record entries made by the scribe were at my direction and in my presence.  I have reviewed the chart and discharge instructions (if applicable) and agree that the record reflects my personal performance and is accurate and complete. 08/02/2021  I,Amber Collins,acting as a scribe for Ann Held, DO.,have documented all relevant documentation on the behalf of Ann Held, DO,as directed by  Ann Held, DO while in the presence of Ann Held, DO.  Ann Held, DO

## 2021-08-02 NOTE — Patient Instructions (Signed)
Muscle Pain, Adult Muscle pain, also called myalgia, is a condition in which a person has pain in one or more muscles in the body. Muscle pain may be mild, moderate, or severe. It may feel sharp, achy, or burning. In most cases, the pain lasts only a short time and goes away without treatment. Muscle pain can result from using muscles in a new or different way or after a period of inactivity. It is normal to feel some muscle pain after starting an exercise program. Muscles that have not been used often will be sore at first. What are the causes? This condition is caused by using muscles in a new or different way after a period of inactivity. Other causes may include: Overuse or muscle strain, especially if you are not in shape. This is the most common cause of muscle pain. Injury or bruising. Infectious diseases, including diseases caused by viruses, such as the flu (influenza). Fibromyalgia.This is a long-term, or chronic, condition that causes muscle tenderness, tiredness (fatigue), and headache. Autoimmune or rheumatologic diseases. These are conditions, such as lupus, in which the body's defense system (immunesystem) attacks areas in the body. Certain medicines, including ACE inhibitors and statins. What are the signs or symptoms? The main symptom of this condition is sore or painful muscles, including during activity and when stretching. You may also have slight swelling. How is this diagnosed? This condition is diagnosed with a physical exam. Your health care provider will ask questions about your pain and when it began. If you have not had muscle pain for very long, your health care provider may want to wait before doing much testing. If your muscle pain has lasted a long time, tests may be done right away. In some cases, this may include tests to rule out certain conditions or illnesses. How is this treated? Treatment for this condition depends on the cause. Home care is often enough to  relieve muscle pain. Your health care provider may also prescribe NSAIDs, such as ibuprofen. Follow these instructions at home: Medicines Take over-the-counter and prescription medicines only as told by your health care provider. Ask your health care provider if the medicine prescribed to you requires you to avoid driving or using machinery. Managing pain, swelling, and discomfort     If directed, put ice on the painful area. To do this: Put ice in a plastic bag. Place a towel between your skin and the bag. Leave the ice on for 20 minutes, 2-3 times a day. For the first 2 days of muscle soreness, or if there is swelling: Do not soak in hot baths. Do not use a hot tub, steam room, sauna, heating pad, or other heat source. After 48-72 hours, you may alternate between applying ice and applying heat as told by your health care provider. If directed, apply heat to the affected area as often as told by your health care provider. Use the heat source that your health care provider recommends, such as a moist heat pack or a heating pad. Place a towel between your skin and the heat source. Leave the heat on for 20-30 minutes. Remove the heat if your skin turns bright red. This is especially important if you are unable to feel pain, heat, or cold. You may have a greater risk of getting burned. If you have an injury, raise (elevate) the injured area above the level of your heart while you are sitting or lying down. Activity  If overuse is causing your muscle pain: Slow down   your activities until the pain goes away. Do regular, gentle exercises if you are not usually active. Warm up before exercising. Stretch before and after exercising. This can help lower the risk of muscle pain. Do not continue working out if the pain is severe. Severe pain could mean that you have injured a muscle. Do not lift anything that is heavier than 5-10 lb (2.3-4.5 kg), or the limit that you are told, until your health  care provider says that it is safe. Return to your normal activities as told by your health care provider. Ask your health care provider what activities are safe for you. General instructions Do not use any products that contain nicotine or tobacco, such as cigarettes, e-cigarettes, and chewing tobacco. These can delay healing. If you need help quitting, ask your health care provider. Keep all follow-up visits as told by your health care provider. This is important. Contact a health care provider if you have: Muscle pain that gets worse and medicines do not help. Muscle pain that lasts longer than 3 days. A rash or fever along with muscle pain. Muscle pain after a tick bite. Muscle pain while working out, even though you are in good physical condition. Redness, soreness, or swelling along with muscle pain. Muscle pain after starting a new medicine or changing the dose of a medicine. Get help right away if you have: Trouble breathing. Trouble swallowing. Muscle pain along with a stiff neck, fever, and vomiting. Severe muscle weakness or you cannot move part of your body. These symptoms may represent a serious problem that is an emergency. Do not wait to see if the symptoms will go away. Get medical help right away. Call your local emergency services (911 in the [place] Do not drive yourself to the hospital. Summary Muscle pain usually lasts only a short time and goes away without treatment. This condition is caused by using muscles in a new or different way after a period of inactivity. If your muscle pain lasts longer than 3 days, tell your health care provider. This information is not intended to replace advice given to you by your health care provider. Make sure you discuss any questions you have with your health care provider. Document Revised: 09/04/2020 Document Reviewed: 09/04/2020 Elsevier Patient Education  2023 [employer]  

## 2021-08-03 LAB — TSH: TSH: 2.14 u[IU]/mL (ref 0.35–5.50)

## 2021-08-03 LAB — CBC WITH DIFFERENTIAL/PLATELET
Basophils Absolute: 0.1 10*3/uL (ref 0.0–0.1)
Basophils Relative: 0.8 % (ref 0.0–3.0)
Eosinophils Absolute: 0.2 10*3/uL (ref 0.0–0.7)
Eosinophils Relative: 3 % (ref 0.0–5.0)
HCT: 40.8 % (ref 39.0–52.0)
Hemoglobin: 13.9 g/dL (ref 13.0–17.0)
Lymphocytes Relative: 17.2 % (ref 12.0–46.0)
Lymphs Abs: 1.1 10*3/uL (ref 0.7–4.0)
MCHC: 34.2 g/dL (ref 30.0–36.0)
MCV: 87.8 fl (ref 78.0–100.0)
Monocytes Absolute: 0.6 10*3/uL (ref 0.1–1.0)
Monocytes Relative: 8.8 % (ref 3.0–12.0)
Neutro Abs: 4.4 10*3/uL (ref 1.4–7.7)
Neutrophils Relative %: 70.2 % (ref 43.0–77.0)
Platelets: 262 10*3/uL (ref 150.0–400.0)
RBC: 4.64 Mil/uL (ref 4.22–5.81)
RDW: 12.2 % (ref 11.5–15.5)
WBC: 6.3 10*3/uL (ref 4.0–10.5)

## 2021-08-03 LAB — COMPREHENSIVE METABOLIC PANEL
ALT: 26 U/L (ref 0–53)
AST: 20 U/L (ref 0–37)
Albumin: 4.2 g/dL (ref 3.5–5.2)
Alkaline Phosphatase: 64 U/L (ref 39–117)
BUN: 13 mg/dL (ref 6–23)
CO2: 28 mEq/L (ref 19–32)
Calcium: 9.7 mg/dL (ref 8.4–10.5)
Chloride: 101 mEq/L (ref 96–112)
Creatinine, Ser: 0.95 mg/dL (ref 0.40–1.50)
GFR: 103.44 mL/min (ref >60.00)
Glucose, Bld: 88 mg/dL (ref 70–99)
Potassium: 4 mEq/L (ref 3.5–5.1)
Sodium: 138 mEq/L (ref 135–145)
Total Bilirubin: 0.5 mg/dL (ref 0.2–1.2)
Total Protein: 7.4 g/dL (ref 6.0–8.3)

## 2021-08-03 LAB — RHEUMATOID FACTOR: Rheumatoid fact SerPl-aCnc: <14 IU/mL (ref <14)

## 2021-08-03 LAB — ANA: Anti Nuclear Antibody (ANA): NEGATIVE

## 2021-08-03 LAB — VITAMIN D 25 HYDROXY (VIT D DEFICIENCY, FRACTURES): VITD: 21.82 ng/mL — ABNORMAL LOW (ref 30.00–100.00)

## 2021-08-03 LAB — VITAMIN B12: Vitamin B-12: 283 pg/mL (ref 211–911)

## 2021-08-03 LAB — SEDIMENTATION RATE: Sed Rate: 12 mm/hr (ref 0–15)

## 2021-08-06 ENCOUNTER — Encounter: Payer: Self-pay | Admitting: Family Medicine

## 2021-08-09 MED ORDER — VITAMIN D (ERGOCALCIFEROL) 1.25 MG (50000 UNIT) PO CAPS: 50000.0000 [IU] | ORAL_CAPSULE | ORAL | 1 refills | Status: DC

## 2021-08-13 ENCOUNTER — Emergency Department (HOSPITAL_BASED_OUTPATIENT_CLINIC_OR_DEPARTMENT_OTHER)
Admission: EM | Admit: 2021-08-13 | Discharge: 2021-08-13 | Disposition: A | Discharge status: Home / Self Care | Payer: Managed Care, Other (non HMO) | Attending: Emergency Medicine | Admitting: Emergency Medicine

## 2021-08-13 ENCOUNTER — Emergency Department (HOSPITAL_BASED_OUTPATIENT_CLINIC_OR_DEPARTMENT_OTHER): Payer: Managed Care, Other (non HMO)

## 2021-08-13 ENCOUNTER — Other Ambulatory Visit: Payer: Self-pay

## 2021-08-13 ENCOUNTER — Encounter (HOSPITAL_BASED_OUTPATIENT_CLINIC_OR_DEPARTMENT_OTHER): Payer: Self-pay

## 2021-08-13 DIAGNOSIS — Z20822 Contact with and (suspected) exposure to covid-19: Secondary | ICD-10-CM | POA: Insufficient documentation

## 2021-08-13 DIAGNOSIS — Z87891 Personal history of nicotine dependence: Secondary | ICD-10-CM | POA: Diagnosis not present

## 2021-08-13 DIAGNOSIS — R000 Tachycardia, unspecified: Secondary | ICD-10-CM | POA: Diagnosis not present

## 2021-08-13 DIAGNOSIS — R509 Fever, unspecified: Secondary | ICD-10-CM

## 2021-08-13 DIAGNOSIS — R319 Hematuria, unspecified: Secondary | ICD-10-CM

## 2021-08-13 DIAGNOSIS — R519 Headache, unspecified: Secondary | ICD-10-CM | POA: Insufficient documentation

## 2021-08-13 LAB — CBC WITH DIFFERENTIAL/PLATELET
Abs Immature Granulocytes: 0.03 10*3/uL (ref 0.00–0.07)
Basophils Absolute: 0.1 10*3/uL (ref 0.0–0.1)
Basophils Relative: 1 %
Eosinophils Absolute: 0.1 10*3/uL (ref 0.0–0.5)
Eosinophils Relative: 1 %
HCT: 44.1 % (ref 39.0–52.0)
Hemoglobin: 15.3 g/dL (ref 13.0–17.0)
Immature Granulocytes: 0 %
Lymphocytes Relative: 9 %
Lymphs Abs: 1 10*3/uL (ref 0.7–4.0)
MCH: 29.7 pg (ref 26.0–34.0)
MCHC: 34.7 g/dL (ref 30.0–36.0)
MCV: 85.6 fL (ref 80.0–100.0)
Monocytes Absolute: 0.8 10*3/uL (ref 0.1–1.0)
Monocytes Relative: 7 %
Neutro Abs: 8.9 10*3/uL — ABNORMAL HIGH (ref 1.7–7.7)
Neutrophils Relative %: 82 %
Platelets: 369 10*3/uL (ref 150–400)
RBC: 5.15 MIL/uL (ref 4.22–5.81)
RDW: 11.5 % (ref 11.5–15.5)
WBC: 10.9 10*3/uL — ABNORMAL HIGH (ref 4.0–10.5)
nRBC: 0 % (ref 0.0–0.2)

## 2021-08-13 LAB — COMPREHENSIVE METABOLIC PANEL
ALT: 53 U/L — ABNORMAL HIGH (ref 0–44)
AST: 26 U/L (ref 15–41)
Albumin: 3.9 g/dL (ref 3.5–5.0)
Alkaline Phosphatase: 85 U/L (ref 38–126)
Anion gap: 9 (ref 5–15)
BUN: 7 mg/dL (ref 6–20)
CO2: 28 mmol/L (ref 22–32)
Calcium: 9.6 mg/dL (ref 8.9–10.3)
Chloride: 98 mmol/L (ref 98–111)
Creatinine, Ser: 0.99 mg/dL (ref 0.61–1.24)
GFR, Estimated: >60 mL/min (ref >60)
Glucose, Bld: 99 mg/dL (ref 70–99)
Potassium: 4 mmol/L (ref 3.5–5.1)
Sodium: 135 mmol/L (ref 135–145)
Total Bilirubin: 1.2 mg/dL (ref 0.3–1.2)
Total Protein: 8.3 g/dL — ABNORMAL HIGH (ref 6.5–8.1)

## 2021-08-13 LAB — SARS CORONAVIRUS 2 BY RT PCR: SARS Coronavirus 2 by RT PCR: NEGATIVE

## 2021-08-13 LAB — SEDIMENTATION RATE: Sed Rate: 55 mm/hr — ABNORMAL HIGH (ref 0–16)

## 2021-08-13 LAB — LACTIC ACID, PLASMA: Lactic Acid, Venous: 1.2 mmol/L (ref 0.5–1.9)

## 2021-08-13 LAB — URINALYSIS, ROUTINE W REFLEX MICROSCOPIC
Bilirubin Urine: NEGATIVE
Glucose, UA: NEGATIVE mg/dL
Ketones, ur: 15 mg/dL — AB
Leukocytes,Ua: NEGATIVE
Nitrite: NEGATIVE
Protein, ur: NEGATIVE mg/dL
Specific Gravity, Urine: 1.02 (ref 1.005–1.030)
pH: 6 (ref 5.0–8.0)

## 2021-08-13 LAB — URINALYSIS, MICROSCOPIC (REFLEX): WBC, UA: NONE SEEN WBC/hpf (ref 0–5)

## 2021-08-13 LAB — C-REACTIVE PROTEIN: CRP: 9.4 mg/dL — ABNORMAL HIGH (ref <1.0)

## 2021-08-13 LAB — RAPID HIV SCREEN (HIV 1/2 AB+AG)
HIV 1/2 Antibodies: NONREACTIVE
HIV-1 P24 Antigen - HIV24: NONREACTIVE

## 2021-08-13 LAB — PROTIME-INR
INR: 1.1 (ref 0.8–1.2)
Prothrombin Time: 14.2 seconds (ref 11.4–15.2)

## 2021-08-13 MED ORDER — ACETAMINOPHEN 325 MG PO TABS
650.0000 mg | ORAL_TABLET | Freq: Once | ORAL | Status: AC
  Administered 2021-08-13: 650 mg via ORAL
  Filled 2021-08-13: qty 2

## 2021-08-13 MED ORDER — SODIUM CHLORIDE 0.9 % IV BOLUS
1000.0000 mL | Freq: Once | INTRAVENOUS | Status: AC
  Administered 2021-08-13: 1000 mL via INTRAVENOUS

## 2021-08-13 NOTE — ED Notes (Signed)
Patient verbalizes understanding of discharge instructions. Opportunity for questioning and answers were provided. Armband removed by staff, pt discharged from ED. Ambulated out to lobby  

## 2021-08-13 NOTE — Discharge Instructions (Signed)
Follow-up with the infectious disease doctor

## 2021-08-13 NOTE — ED Provider Notes (Signed)
[facility] Provider Note   CSN: [id number] Arrival date & time: 08/13/21  1410     History  Chief Complaint  Patient presents with   Headache    Tyler [name] is a 36 y.o. male.  36 year old otherwise healthy male presents with concern for intermittent fevers for the past 3 months.  At this time he complains of a headache x1 week, fever, pain in his knees, ankles, wrists.  Went to [place] in March, was sick for a week with sore throat, sneezing, cough. Took meds and things got better but came back. Got a flu shot. At week 2 developed a sore throat, body felt hot, took DayQuil and NyQUil x 2 weeks and symptoms got better. Then returned in 3-4 days with sore throat and fever, eyes red. Went to UC, strep/covid negative, given eye drops, allergy medicine and other unknown med. Symptoms started to improve. 1 day after completing medications sore throat retuned and was most severe throat pain ever, couldn't swallow. Went back to UC and given abx x 10 days, started to feel better for about a week. At end of May started to feel body aches, headache. Went back to PCP who ran a lab panel, given eye drops for eye redness. Has been taking Motrin x 2 weeks. Fever and chills more so at night, severe headache at night for the past week (unsure if this is when the Motrin wears off). Motrin helps the headache for about 2 hours, headache is worse today. Joint pain, knees, ankle, wrists, lower back pain (not in hips and shoulders).   Reports 9lb weight loss, loss of appetite despite trying to eat.  No changes in bladder habits. Did have a few loose stools yesterday.  No tick bites, no rash, no easy bruising.  Denies CP or SHOB, no abdominal pain.  No sore throat currently.  Denies joint swelling or redness.  Last took Advil at 5am today.   Former smoker, stopped 2-3 months ago. Is vaping. No alcohol for past 3 months.   No IVDA, no new sexual partners (married,  monogamous relationship, wife is pregnant).  Not on any daily meds. Elevated cholesterol 2 years ago, not on medication. No history of HTN.   Wife also went to [place], was sick in [place] but has been fine since.        Home Medications Prior to Admission medications   Medication Sig Start Date End Date Taking? Authorizing Provider  moxifloxacin (VIGAMOX) 0.5 % ophthalmic solution Place 1 drop into both eyes 3 (three) times daily. 08/02/21   Ann Held, DO  Vitamin D, Ergocalciferol, (DRISDOL) 1.25 MG (50000 UNIT) CAPS capsule Take 1 capsule (50,000 Units total) by mouth every 7 (seven) days. 08/09/21   Ann Held, DO      Allergies    Bactrim [sulfamethoxazole-trimethoprim]    Review of Systems   Review of Systems Negative except as per HPI Physical Exam Updated Vital Signs BP 92/67   Pulse 88   Temp 98.3 F (36.8 C)   Resp 11   Ht '5\' 5"'$  (1.651 m)   Wt 60.8 kg   SpO2 94%   BMI 22.30 kg/m  Physical Exam Vitals and nursing note reviewed.  Constitutional:      General: He is not in acute distress.    Appearance: He is well-developed. He is not diaphoretic.  HENT:     Head: Normocephalic and atraumatic.     Right Ear: Tympanic  membrane and ear canal normal.     Left Ear: Tympanic membrane and ear canal normal.     Nose: Nose normal.     Mouth/Throat:     Mouth: Mucous membranes are moist.  Eyes:     Conjunctiva/sclera: Conjunctivae normal.  Cardiovascular:     Rate and Rhythm: Regular rhythm. Tachycardia present.     Pulses: Normal pulses.     Heart sounds: Normal heart sounds.  Pulmonary:     Effort: Pulmonary effort is normal.     Breath sounds: Normal breath sounds.  Abdominal:     Palpations: Abdomen is soft.     Tenderness: There is no abdominal tenderness.  Musculoskeletal:        General: Tenderness present. No swelling or deformity.     Cervical back: Neck supple. No rigidity.     Right lower leg: No edema.     Left lower leg: No  edema.     Comments: No joint swelling or redness. Tenderness to knees is located more so   Lymphadenopathy:     Cervical: No cervical adenopathy.  Skin:    General: Skin is warm and dry.     Findings: No erythema or rash.  Neurological:     Mental Status: He is alert and oriented to person, place, and time.     Sensory: No sensory deficit.     Motor: No weakness.  Psychiatric:        Behavior: Behavior normal.     ED Results / Procedures / Treatments   Labs (all labs ordered are listed, but only abnormal results are displayed) Labs Reviewed  COMPREHENSIVE METABOLIC PANEL - Abnormal; Notable for the following components:      Result Value   Total Protein 8.3 (*)    ALT 53 (*)    All other components within normal limits  CBC WITH DIFFERENTIAL/PLATELET - Abnormal; Notable for the following components:   WBC 10.9 (*)    Neutro Abs 8.9 (*)    All other components within normal limits  URINALYSIS, ROUTINE W REFLEX MICROSCOPIC - Abnormal; Notable for the following components:   Hgb urine dipstick MODERATE (*)    Ketones, ur 15 (*)    All other components within normal limits  URINALYSIS, MICROSCOPIC (REFLEX) - Abnormal; Notable for the following components:   Bacteria, UA RARE (*)    All other components within normal limits  SEDIMENTATION RATE - Abnormal; Notable for the following components:   Sed Rate 55 (*)    All other components within normal limits  SARS CORONAVIRUS 2 BY RT PCR  CULTURE, BLOOD (ROUTINE X 2)  CULTURE, BLOOD (ROUTINE X 2)  LACTIC ACID, PLASMA  PROTIME-INR  RAPID HIV SCREEN (HIV 1/2 AB+AG)  LYME DISEASE SEROLOGY W/REFLEX  ROCKY MTN SPOTTED FVR ABS PNL(IGG+IGM)  C-REACTIVE PROTEIN  PLASMODIUM SP. PCR    EKG None  Radiology CT Head Wo Contrast  Result Date: 08/13/2021 CLINICAL DATA:  Fever and headache with weight loss EXAM: CT HEAD WITHOUT CONTRAST TECHNIQUE: Contiguous axial images were obtained from the base of the skull through the vertex  without intravenous contrast. RADIATION DOSE REDUCTION: This exam was performed according to the departmental dose-optimization program which includes automated exposure control, adjustment of the mA and/or kV according to patient size and/or use of iterative reconstruction technique. COMPARISON:  04/30/2015 MRI FINDINGS: Brain: No evidence of acute infarction, hemorrhage, hydrocephalus, extra-axial collection or mass lesion/mass effect. Vascular: No hyperdense vessel or unexpected calcification. Skull: Normal. Negative  for fracture or focal lesion. Sinuses/Orbits: No acute finding. Other: None. IMPRESSION: Normal head CT without contrast for age Electronically Signed   By: Jerilynn Mages.  Shick M.D.   On: 08/13/2021 16:07   DG Chest 2 View  Result Date: 08/13/2021 CLINICAL DATA:  Suspected sepsis. EXAM: CHEST - 2 VIEW COMPARISON:  Chest two views 11/07/2016 and 06/06/2016 FINDINGS: Cardiac silhouette and mediastinal contours are within normal limits. Mild bilateral interstitial thickening is new from prior. No focal airspace opacity to indicate pneumonia. No pleural effusion or pneumothorax. No acute skeletal abnormality. IMPRESSION: There is mild bilateral interstitial thickening that is new 11/07/2016. In the acute setting this may represent acute mild interstitial pulmonary edema. In the chronic setting this may represent chronic interstitial fibrosis/scarring. Electronically Signed   By: Yvonne Kendall M.D.   On: 08/13/2021 15:02    Procedures Procedures    Medications Ordered in ED Medications  sodium chloride 0.9 % bolus 1,000 mL ( Intravenous Stopped 08/13/21 1723)  acetaminophen (TYLENOL) tablet 650 mg (650 mg Oral Given 08/13/21 1608)  sodium chloride 0.9 % bolus 1,000 mL (1,000 mLs Intravenous New Bag/Given 08/13/21 1844)    ED Course/ Medical Decision Making/ A&P Clinical Course as of 08/13/21 1853  Fri Aug 13, 2021  1846 Fever x months. Needs to FU with ID outpatient. Does not need LP [BH]     Clinical Course User Index [BH] Henderly, Britni A, PA-C                           Medical Decision Making Amount and/or Complexity of Data Reviewed Labs: ordered. Radiology: ordered.  Risk OTC drugs.   This patient presents to the ED for concern of fever, headache, joint pain, this involves an extensive number of treatment options, and is a complaint that carries with it a high risk of complications and morbidity.  The differential diagnosis includes but not limited to malaria, tick bourne illness, sepsis, viral illness, leukemia, rheumatologic condition , meningitis    Co morbidities that complicate the patient evaluation  Otherwise healthy male   Additional history obtained:  External records from outside source obtained and reviewed including visit to PCP office on 08/02/2021, labs reviewed, no significant lab results from that visit   Lab Tests:  I Ordered, and personally interpreted labs.  The pertinent results include: HIV negative, CBC with nonspecific leukocytosis white count 10.9 with increase in neutrophils to 8.9.  CMP with mildly elevated ALT at 53.  COVID-negative.  Urinalysis with moderate hemoglobin, ketones, rare bacteria, will send for culture.  Lactic acid reassuring at 1.2.  Sed rate is mildly elevated at 55. Additional lab work pending including a blood culture, Lyme, [place] spotted fever and malaria testing which will be followed up at a later date. CRP pending at time of signout.   Imaging Studies ordered:  I ordered imaging studies including chest x-ray, head CT I independently visualized and interpreted imaging which showed CT head unremarkable. I agree with the radiologist interpretation.  Radiology interpretation of chest x-ray as interstitial thickening, consider acute mild interstitial pulmonary edema versus chronic interstitial fibrosis/scarring.  No respiratory symptoms, shortness of breath, lower extremity and at this time    Consultations  Obtained:  I requested consultation with the ER attending, Dr. Tamera Punt,  and discussed lab and imaging findings as well as pertinent plan - they recommend: Obtain cultures, add on malaria, likely follow-up with infectious disease   Problem List / ED Course /  Critical interventions / Medication management  36 year old male with complaint of intermittent fever x3 months as per detailed history above.  On exam, appears to feel uncomfortable, he is febrile.  He has pain to bilateral proximal medial tibia areas, no specific joint pain in this area, no erythema, no joint swelling.  Broad differential is considered for this patient, labs compared to prior obtained at PCP earlier this month.  He does have a slight increase in his sed rate which is nonspecific.  Mild leukocytosis, also nonspecific.  Discussed with Dr. Tamera Punt, ER attending.  No nuchal rigidity at this time, doubt meningitis with history.  Plan is to likely refer to ID for follow-up.  Final disposition pending at time of signout at change of shift. I ordered medication including IV fluids, Tylenol for headache, fever Reevaluation of the patient after these medicines showed that the patient improved I have reviewed the patients home medicines and have made adjustments as needed   Social Determinants of Health:  We will refer to ID for follow-up, has ability to follow-up   Test / Admission - Considered:  Consider LP for fever with headache however symptoms have been intermittent x 3 months, no nuchal rigidity at this time, felt not necessary at this point in time.         Final Clinical Impression(s) / ED Diagnoses Final diagnoses:  Fever, unknown origin  Hematuria, unspecified type    Rx / DC Orders ED Discharge Orders     None         Tacy Learn, PA-C 08/13/21 1853    Malvin Johns, MD 08/13/21 2004

## 2021-08-13 NOTE — ED Triage Notes (Signed)
Patient c/o HA x 2 weeks with weight loss. Intermittent fevers reported. Patient last medicated with Advil at 8am

## 2021-08-13 NOTE — ED Notes (Signed)
Lab notified of c reactive protein and plasmodium order

## 2021-08-13 NOTE — ED Provider Notes (Signed)
Care assumed from previous provider at shift change.  See note for full HPI.  Plan to follow-up on remaining labs and dc with ID FU Physical Exam  BP 101/66   Pulse 73   Temp 98.9 F (37.2 C) (Oral)   Resp (!) 23   Ht '5\' 5"'$  (1.651 m)   Wt 60.8 kg   SpO2 94%   BMI 22.30 kg/m   Physical Exam Vitals and nursing note reviewed.  Constitutional:      General: He is not in acute distress.    Appearance: He is well-developed. He is not ill-appearing, toxic-appearing or diaphoretic.  HENT:     Head: Atraumatic.  Eyes:     Pupils: Pupils are equal, round, and reactive to light.  Cardiovascular:     Rate and Rhythm: Normal rate and regular rhythm.  Pulmonary:     Effort: Pulmonary effort is normal. No respiratory distress.  Abdominal:     General: There is no distension.     Palpations: Abdomen is soft.  Musculoskeletal:        General: Normal range of motion.     Cervical back: Normal range of motion and neck supple.  Skin:    General: Skin is warm and dry.  Neurological:     General: No focal deficit present.     Mental Status: He is alert and oriented to person, place, and time.     Procedures  Procedures Labs Reviewed  COMPREHENSIVE METABOLIC PANEL - Abnormal; Notable for the following components:      Result Value   Total Protein 8.3 (*)    ALT 53 (*)    All other components within normal limits  CBC WITH DIFFERENTIAL/PLATELET - Abnormal; Notable for the following components:   WBC 10.9 (*)    Neutro Abs 8.9 (*)    All other components within normal limits  URINALYSIS, ROUTINE W REFLEX MICROSCOPIC - Abnormal; Notable for the following components:   Hgb urine dipstick MODERATE (*)    Ketones, ur 15 (*)    All other components within normal limits  URINALYSIS, MICROSCOPIC (REFLEX) - Abnormal; Notable for the following components:   Bacteria, UA RARE (*)    All other components within normal limits  SEDIMENTATION RATE - Abnormal; Notable for the following  components:   Sed Rate 55 (*)    All other components within normal limits  SARS CORONAVIRUS 2 BY RT PCR  CULTURE, BLOOD (ROUTINE X 2)  CULTURE, BLOOD (ROUTINE X 2)  LACTIC ACID, PLASMA  PROTIME-INR  RAPID HIV SCREEN (HIV 1/2 AB+AG)  LYME DISEASE SEROLOGY W/REFLEX  ROCKY MTN SPOTTED FVR ABS PNL(IGG+IGM)  C-REACTIVE PROTEIN  PLASMODIUM SP. PCR   CT Head Wo Contrast  Result Date: 08/13/2021 CLINICAL DATA:  Fever and headache with weight loss EXAM: CT HEAD WITHOUT CONTRAST TECHNIQUE: Contiguous axial images were obtained from the base of the skull through the vertex without intravenous contrast. RADIATION DOSE REDUCTION: This exam was performed according to the departmental dose-optimization program which includes automated exposure control, adjustment of the mA and/or kV according to patient size and/or use of iterative reconstruction technique. COMPARISON:  04/30/2015 MRI FINDINGS: Brain: No evidence of acute infarction, hemorrhage, hydrocephalus, extra-axial collection or mass lesion/mass effect. Vascular: No hyperdense vessel or unexpected calcification. Skull: Normal. Negative for fracture or focal lesion. Sinuses/Orbits: No acute finding. Other: None. IMPRESSION: Normal head CT without contrast for age Electronically Signed   By: Jerilynn Mages.  Shick M.D.   On: 08/13/2021 16:07  DG Chest 2 View  Result Date: 08/13/2021 CLINICAL DATA:  Suspected sepsis. EXAM: CHEST - 2 VIEW COMPARISON:  Chest two views 11/07/2016 and 06/06/2016 FINDINGS: Cardiac silhouette and mediastinal contours are within normal limits. Mild bilateral interstitial thickening is new from prior. No focal airspace opacity to indicate pneumonia. No pleural effusion or pneumothorax. No acute skeletal abnormality. IMPRESSION: There is mild bilateral interstitial thickening that is new 11/07/2016. In the acute setting this may represent acute mild interstitial pulmonary edema. In the chronic setting this may represent chronic interstitial  fibrosis/scarring. Electronically Signed   By: Yvonne Kendall M.D.   On: 08/13/2021 15:02    ED Course / MDM   Clinical Course as of 08/13/21 2136  Fri Aug 13, 2021  1846 Fever x months. Needs to FU with ID outpatient. Does not need LP [BH]    Clinical Course User Index [BH] Stacyann Mcconaughy A, PA-C   Medical Decision Making Amount and/or Complexity of Data Reviewed Labs: ordered. Radiology: ordered.  Risk OTC drugs.   36 year old who unfortunately has had fever as well as multitude of other symptoms over the last 3 months.  He has been worked up multiple times for similar without clear etiology.  On arrival is febrile, tachycardic however no clear source of infection.  Labs do show a mildly elevated sed rate According to the lab CRP will not be back for 12 hours and is a send out test.  We will have him follow-up with ID or return for new or worsening symptoms       Milfred Krammes A, PA-C 08/13/21 2136    Malvin Johns, MD 08/13/21 2257

## 2021-08-15 LAB — ROCKY MTN SPOTTED FVR ABS PNL(IGG+IGM)
RMSF IgG: NEGATIVE
RMSF IgM: 0.48 index (ref 0.00–0.89)

## 2021-08-16 LAB — LYME DISEASE SEROLOGY W/REFLEX: Lyme Total Antibody EIA: NEGATIVE

## 2021-08-17 LAB — PLASMODIUM SP. PCR: Plasmodium Sp. PCR: NEGATIVE

## 2021-08-18 ENCOUNTER — Telehealth: Payer: Self-pay

## 2021-08-18 LAB — CULTURE, BLOOD (ROUTINE X 2)
Culture: NO GROWTH
Culture: NO GROWTH
Special Requests: ADEQUATE
Special Requests: ADEQUATE

## 2021-08-18 NOTE — Telephone Encounter (Signed)
Pt is expecting a baby next week , pt needs tdap . Appt scheduled for 08/19/21 , okay for pt to receive immunization .

## 2021-08-19 ENCOUNTER — Ambulatory Visit (INDEPENDENT_AMBULATORY_CARE_PROVIDER_SITE_OTHER): Payer: Managed Care, Other (non HMO)

## 2021-08-19 DIAGNOSIS — Z23 Encounter for immunization: Secondary | ICD-10-CM | POA: Diagnosis not present

## 2021-08-27 ENCOUNTER — Other Ambulatory Visit: Payer: Self-pay

## 2021-08-27 ENCOUNTER — Ambulatory Visit (INDEPENDENT_AMBULATORY_CARE_PROVIDER_SITE_OTHER): Payer: Managed Care, Other (non HMO) | Admitting: Internal Medicine

## 2021-08-27 ENCOUNTER — Encounter: Payer: Self-pay | Admitting: Internal Medicine

## 2021-08-27 DIAGNOSIS — R509 Fever, unspecified: Secondary | ICD-10-CM

## 2021-08-27 NOTE — Progress Notes (Addendum)
Patient ID: Tyler [name], male   DOB: [birth year], 36 y.o.   MRN: [id number]    [facility]ious [facility]      Reason for Consult: fever   Referring Physician: Dr. Tamera Punt    Patient ID: Tyler [name], male    DOB: 11/11/1985, 36 y.o.   MRN: [id number]  HPI:   Here for evaluation of a prolonged fever.   He has been seen by his PCP, urgent care, ED for a constellation of symptoms.  It began in March with URI-like symptoms while he was traveling in [place] and included a sore throat and fever.  He was given supportive medication and an antibiotic and things improved.  He though then developed a sore throat and has had muscle aches and joint aches around his knees, though he points to a fleshy area behind his knees.  No joint swelling.  He reports a fever up to 101 and has been taking Advil and now more recently acetaminophen.  He had a mildly elevated WBC, ESR to 55, negative RMSF, negative Lyme, negative HIV.  ALT was mildly elevated at 53.  He also describes a headache that is patchy on his head, all over and sensitive to touch.   CXR with mild interstitial thickening.  No shortness of breath, no lymph node swelling.  Some weight loss with poor appetite partly related to work stress and having a new baby.    Past Medical History:  Diagnosis Date   History of hemorrhoids    history of since child   Other specified disorders of liver    Tonsillitis    Urinary dysfunction    [facility]    Prior to Admission medications   Medication Sig Start Date End Date Taking? Authorizing Provider  moxifloxacin (VIGAMOX) 0.5 % ophthalmic solution Place 1 drop into both eyes 3 (three) times daily. 08/02/21   Ann Held, DO  Vitamin D, Ergocalciferol, (DRISDOL) 1.25 MG (50000 UNIT) CAPS capsule Take 1 capsule (50,000 Units total) by mouth every 7 (seven) days. 08/09/21   Ann Held, DO    Allergies  Allergen Reactions   Bactrim  [Sulfamethoxazole-Trimethoprim] Other (See Comments) and Itching    Skin reactions.  Skin reactions.     Social History   Tobacco Use   Smoking status: Every Day    Packs/day: 0.50    Years: 10.00    Total pack years: 5.00    Types: Cigarettes   Smokeless tobacco: Never  Substance Use Topics   Alcohol use: No    Alcohol/week: 4.0 standard drinks of alcohol    Types: 4 Cans of beer per week    Comment: rarely    Drug use: No    Family History  Problem Relation Age of Onset   Hypertension Mother    Kidney disease Paternal Uncle     Review of Systems  Respiratory: negative for cough or sputum Gastrointestinal: negative for nausea and diarrhea Genitourinary: negative for frequency and dysuria Hematologic/lymphatic: negative for lymphadenopathy All other systems reviewed and are negative    Constitutional: in no apparent distress There were no vitals filed for this visit. EYES: anicteric ENMT: no thrush red streaks in back of throat Cardiovascular: Cor RRR Respiratory: clear GI: soft, nt Musculoskeletal: no edema, no joint swelling Skin: no rash Hematologic: no cervical or supraclavicular lad  Labs: Lab Results  Component Value Date   WBC 10.9 (H) 08/13/2021   HGB 15.3 08/13/2021   HCT  44.1 08/13/2021   MCV 85.6 08/13/2021   PLT 369 08/13/2021    Lab Results  Component Value Date   CREATININE 0.99 08/13/2021   BUN 7 08/13/2021   NA 135 08/13/2021   K 4.0 08/13/2021   CL 98 08/13/2021   CO2 28 08/13/2021    Lab Results  Component Value Date   ALT 53 (H) 08/13/2021   AST 26 08/13/2021   ALKPHOS 85 08/13/2021   BILITOT 1.2 08/13/2021   INR 1.1 08/13/2021     Assessment: FUO.  No clear concern for any infection.  Rheumatologic tests also unrevealing/negative.  His ESR was up a bit recently but is non-specific.  He also describes some recent low back pain.  At this point I will recheck the ESR, check a protein electrophoresis, LDH and repeat his AST/ALT.   I advised him as well to keep a temperature log and to stop all medications unless needed rather than taking regularly.   I discussed the headache and seems most consistent with a tension headache, particularly with his recent stressors worsened by his illness.  No concerning signs - does not wake him up at night, no am vomiting.  I discussed breathing and self-care.   Plan: 1)  labs as above 2) stop medications  3) return in 2 weeks to review  I discussed that often no etiology identified and can take 6 months or more to resolve.  No treatment indicated.

## 2021-09-01 LAB — PROTEIN ELECTROPHORESIS, SERUM, WITH REFLEX
Albumin ELP: 3.7 g/dL — ABNORMAL LOW (ref 3.8–4.8)
Alpha 1: 0.4 g/dL — ABNORMAL HIGH (ref 0.2–0.3)
Alpha 2: 1 g/dL — ABNORMAL HIGH (ref 0.5–0.9)
Beta 2: 0.4 g/dL (ref 0.2–0.5)
Beta Globulin: 0.4 g/dL (ref 0.4–0.6)
Gamma Globulin: 1.2 g/dL (ref 0.8–1.7)
Total Protein: 7.1 g/dL (ref 6.1–8.1)

## 2021-09-01 LAB — HEPATIC FUNCTION PANEL
AG Ratio: 1.4 (calc) (ref 1.0–2.5)
ALT: 17 U/L (ref 9–46)
AST: 15 U/L (ref 10–40)
Albumin: 4 g/dL (ref 3.6–5.1)
Alkaline phosphatase (APISO): 61 U/L (ref 36–130)
Bilirubin, Direct: 0.1 mg/dL (ref 0.0–0.2)
Globulin: 2.8 g/dL (calc) (ref 1.9–3.7)
Indirect Bilirubin: 0.4 mg/dL (calc) (ref 0.2–1.2)
Total Bilirubin: 0.5 mg/dL (ref 0.2–1.2)
Total Protein: 6.8 g/dL (ref 6.1–8.1)

## 2021-09-01 LAB — CK: Total CK: 55 U/L (ref 44–196)

## 2021-09-01 LAB — LACTATE DEHYDROGENASE: LDH: 91 U/L — ABNORMAL LOW (ref 100–220)

## 2021-09-01 LAB — SEDIMENTATION RATE: Sed Rate: 31 mm/h — ABNORMAL HIGH (ref 0–15)

## 2021-09-09 ENCOUNTER — Encounter: Payer: Self-pay | Admitting: Internal Medicine

## 2021-09-09 ENCOUNTER — Other Ambulatory Visit: Payer: Self-pay

## 2021-09-09 ENCOUNTER — Ambulatory Visit (INDEPENDENT_AMBULATORY_CARE_PROVIDER_SITE_OTHER): Payer: Managed Care, Other (non HMO) | Admitting: Internal Medicine

## 2021-09-09 DIAGNOSIS — R634 Abnormal weight loss: Secondary | ICD-10-CM

## 2021-09-09 DIAGNOSIS — R509 Fever, unspecified: Secondary | ICD-10-CM | POA: Diagnosis not present

## 2021-09-09 NOTE — Progress Notes (Signed)
[place] for Infectious Disease  Patient Active Problem List   Diagnosis Date Noted   Unintentional weight loss 09/09/2021    Priority: High   FUO (fever of unknown origin) 08/27/2021    Priority: High   RUQ pain 12/11/2012    Priority: High   New daily persistent headache 12/04/2020   Fertility testing 11/12/2020   Hyperglycemia 11/12/2020   Palpitations 02/01/2019   Hair loss 01/13/2016   Urinary frequency 01/13/2016   Right-sided chest pain 08/27/2015   Preventative health care 06/21/2015   Blood in stool 06/21/2015   GERD (gastroesophageal reflux disease) 07/12/2013   Liver lesion, right lobe-hyperechoic 7 mm max 12/11/2012   Elevated amylase 11/26/2012    Patient's Medications  New Prescriptions   No medications on file  Previous Medications   ACETAMINOPHEN (TYLENOL PO)    Take by mouth.   VITAMIN D, ERGOCALCIFEROL, (DRISDOL) 1.25 MG (50000 UNIT) CAPS CAPSULE    Take 1 capsule (50,000 Units total) by mouth every 7 (seven) days.  Modified Medications   No medications on file  Discontinued Medications   No medications on file    Subjective: Noel is in for his routine follow-up visit.  He was seen in late April and again in early May at an urgent care center with sore throat and fever.  Rapid strep test were negative x2.  Throat culture was negative.  Testing for influenza and COVID were both negative as well.  At the time of his second visit he was noted to have some pharyngeal exudates and was treated with amoxicillin/clavulanate for 10 days.  He says that his sore throat resolved and he felt better.  He says that he started having recurrent fever in late May associated with some headache.  He says he has also developed anorexia, intermittent nausea and vomiting and has lost about 20 pounds.  He saw his PCP on 08/02/2021 noting redness of his eye, headache, fever, chills, fatigue and diffuse body aches.  No significant abnormalities were noted on exam.   His CBC and CMP were both normal.  His sed rate was normal at 12.  ANA and rheumatoid factor are both negative.  He was treated with moxifloxacin eyedrops for possible conjunctivitis.    He was seen again in the ED on 08/13/2021.  Normal head CT scan.  Chest x-ray revealed only nonspecific interstitial thickening.  CBC was normal except for mildly elevated white blood cell count of 10,900 with a predominance of neutrophils.  His CMP showed slight elevation of his total protein and his ALT was up slightly at 53.  His sed rate was up to 55.  Blood cultures were negative.  HIV, Lyme and [facility] spotted fever antibodies were negative.  PCR testing for Plasmodium was also negative.    He was referred to [place] and saw my partner, Dr. Talbot Grumbling in 08/27/2021.  Repeat sed rate was down to 31.  Normal CK.  His LDH was actually low.  Serum protein electrophoresis revealed only nonspecific changes.  His ALT had normalized.  He was instructed to stop taking daily acetaminophen or ibuprofen which she has done.  He has only needed to take something for fever and headache 3 times in the past week.  He tells me that he has not taken his temperature recently but has felt feverish and has continued to have intermittent headaches but that both have diminished over the last 2 weeks.  He continues to be  bothered by intermittent pain in his left knee both ankles and feet especially when he first gets up in the morning.  He has not noted any swelling in his joints.  He also continues to be bothered by anorexia, nausea and intermittent vomiting.  He says that he has lost about 20 pounds over the last 5 weeks.  Note that he has been evaluated on multiple occasions over the past decade for abdominal pain and headache.  Review of Systems: Review of Systems  Constitutional:  Positive for chills, diaphoresis, fever, malaise/fatigue and weight loss.  HENT:  Negative for congestion and sore throat.   Eyes:  Negative for blurred  vision, pain and redness.  Respiratory:  Positive for cough. Negative for sputum production and shortness of breath.        Only mild stable cough he admitted that he believes is due to his habit of vaping.  Cardiovascular:  Negative for chest pain.  Gastrointestinal:  Positive for abdominal pain, nausea and vomiting. Negative for diarrhea and heartburn.  Genitourinary:  Negative for dysuria.  Musculoskeletal:  Positive for joint pain and myalgias.  Skin:  Negative for rash.  Neurological:  Positive for headaches.  Psychiatric/Behavioral:  Negative for substance abuse.     Past Medical History:  Diagnosis Date   History of hemorrhoids    history of since child   Other specified disorders of liver    Tonsillitis    Urinary dysfunction    [facility]    Social History   Tobacco Use   Smoking status: Every Day    Packs/day: 0.50    Years: 10.00    Total pack years: 5.00    Types: Cigarettes   Smokeless tobacco: Never   Tobacco comments:    Very moderate, down to 1 cigarette 2 x day  Vaping Use   Vaping Use: Some days  Substance Use Topics   Alcohol use: No    Alcohol/week: 4.0 standard drinks of alcohol    Types: 4 Cans of beer per week    Comment: rarely    Drug use: No    Family History  Problem Relation Age of Onset   Hypertension Mother    Kidney disease Paternal Uncle     Allergies  Allergen Reactions   Bactrim [Sulfamethoxazole-Trimethoprim] Other (See Comments) and Itching    Skin reactions.  Skin reactions.     Objective: Vitals:   09/09/21 1112  BP: 122/79  Pulse: (!) 111  Temp: 98 F (36.7 C)  Weight: 126 lb (57.2 kg)  Height: '5\' 5"'$  (1.651 m)   Body mass index is 20.97 kg/m.  Physical Exam Constitutional:      General: He is not in acute distress.    Appearance: He is not ill-appearing.     Comments: He is alert and pleasant.  His weight is down 18 pounds since last October including 12 pounds over the past month.  HENT:      Nose: No congestion.     Mouth/Throat:     Mouth: Mucous membranes are moist.     Pharynx: No oropharyngeal exudate or posterior oropharyngeal erythema.  Eyes:     Conjunctiva/sclera: Conjunctivae normal.  Cardiovascular:     Rate and Rhythm: Normal rate and regular rhythm.     Heart sounds: No murmur heard. Pulmonary:     Effort: Pulmonary effort is normal.     Breath sounds: Normal breath sounds.  Abdominal:     Palpations: Abdomen is soft. There  is no mass.     Tenderness: There is abdominal tenderness.     Comments: He has some right upper quadrant pain with palpation.  I do not feel a liver edge or other masses.  Musculoskeletal:        General: No swelling or tenderness.     Comments: He has no unusual swelling, redness or warmth of his knees, ankles or feet.  Skin:    Findings: No erythema or rash.  Psychiatric:        Mood and Affect: Mood normal.     Lab Results    Problem List Items Addressed This Visit       High   FUO (fever of unknown origin)    As of his FUO and unintentional weight loss remains unclear.  Instructed him to take his temperature every day in the late afternoon or evening and at other times if he feels feverish and keep a log to bring in with him at the time of his next visit.  We will also check blood work today including CBC, CMP, hepatitis panel, EBV and CMV serology.  I have also ordered an abdominal and pelvic CT scan with contrast.  He will follow-up in 3 weeks.      Relevant Orders   CT ABDOMEN PELVIS W CONTRAST   Comprehensive metabolic panel   CBC   Hepatitis panel, acute   CMV IgM   Epstein-Barr virus VCA antibody panel   Epstein-Barr virus early D antigen antibody, IgG   Epstein-Barr virus nuclear antigen antibody, IgG   Unintentional weight loss     Michel Bickers, MD [facility] for [facility]ious [place] [facility] [phone] pager   [phone] cell 09/09/2021, 12:26 PM

## 2021-09-09 NOTE — Assessment & Plan Note (Signed)
As of his FUO and unintentional weight loss remains unclear.  Instructed him to take his temperature every day in the late afternoon or evening and at other times if he feels feverish and keep a log to bring in with him at the time of his next visit.  We will also check blood work today including CBC, CMP, hepatitis panel, EBV and CMV serology.  I have also ordered an abdominal and pelvic CT scan with contrast.  He will follow-up in 3 weeks.

## 2021-09-14 ENCOUNTER — Ambulatory Visit (HOSPITAL_COMMUNITY): Payer: Managed Care, Other (non HMO)

## 2021-09-14 ENCOUNTER — Ambulatory Visit: Payer: Managed Care, Other (non HMO) | Admitting: Internal Medicine

## 2021-09-15 ENCOUNTER — Other Ambulatory Visit (HOSPITAL_COMMUNITY): Payer: Managed Care, Other (non HMO)

## 2021-09-15 LAB — COMPREHENSIVE METABOLIC PANEL
AG Ratio: 1.3 (calc) (ref 1.0–2.5)
ALT: 18 U/L (ref 9–46)
AST: 14 U/L (ref 10–40)
Albumin: 4.1 g/dL (ref 3.6–5.1)
Alkaline phosphatase (APISO): 63 U/L (ref 36–130)
BUN: 7 mg/dL (ref 7–25)
CO2: 30 mmol/L (ref 20–32)
Calcium: 9.8 mg/dL (ref 8.6–10.3)
Chloride: 100 mmol/L (ref 98–110)
Creat: 1.06 mg/dL (ref 0.60–1.26)
Globulin: 3.1 g/dL (calc) (ref 1.9–3.7)
Glucose, Bld: 108 mg/dL — ABNORMAL HIGH (ref 65–99)
Potassium: 4 mmol/L (ref 3.5–5.3)
Sodium: 138 mmol/L (ref 135–146)
Total Bilirubin: 0.7 mg/dL (ref 0.2–1.2)
Total Protein: 7.2 g/dL (ref 6.1–8.1)

## 2021-09-15 LAB — CMV IGM: CMV IgM: <30 AU/mL

## 2021-09-15 LAB — CBC
HCT: 45.4 % (ref 38.5–50.0)
Hemoglobin: 15.1 g/dL (ref 13.2–17.1)
MCH: 28.7 pg (ref 27.0–33.0)
MCHC: 33.3 g/dL (ref 32.0–36.0)
MCV: 86.1 fL (ref 80.0–100.0)
MPV: 9 fL (ref 7.5–12.5)
Platelets: 309 10*3/uL (ref 140–400)
RBC: 5.27 10*6/uL (ref 4.20–5.80)
RDW: 11.7 % (ref 11.0–15.0)
WBC: 6.9 10*3/uL (ref 3.8–10.8)

## 2021-09-15 LAB — HEPATITIS PANEL, ACUTE
Hep A IgM: NONREACTIVE
Hep B C IgM: NONREACTIVE
Hepatitis B Surface Ag: NONREACTIVE
Hepatitis C Ab: NONREACTIVE

## 2021-09-15 LAB — EPSTEIN-BARR VIRUS VCA ANTIBODY PANEL
EBV NA IgG: 439 U/mL — ABNORMAL HIGH
EBV VCA IgG: >750 U/mL — ABNORMAL HIGH
EBV VCA IgM: <36 U/mL

## 2021-09-15 LAB — EPSTEIN-BARR VIRUS EARLY D ANTIGEN ANTIBODY, IGG: EBV EA IgG: 68.5 U/mL — ABNORMAL HIGH (ref <9.00)

## 2021-09-17 ENCOUNTER — Ambulatory Visit (HOSPITAL_COMMUNITY)
Admission: RE | Admit: 2021-09-17 | Discharge: 2021-09-17 | Disposition: A | Discharge status: Home / Self Care | Payer: Managed Care, Other (non HMO) | Source: Ambulatory Visit | Attending: Internal Medicine | Admitting: Internal Medicine

## 2021-09-17 DIAGNOSIS — R509 Fever, unspecified: Secondary | ICD-10-CM | POA: Diagnosis present

## 2021-09-17 MED ORDER — IOHEXOL 300 MG/ML  SOLN
100.0000 mL | Freq: Once | INTRAMUSCULAR | Status: AC | PRN
  Administered 2021-09-17: 100 mL via INTRAVENOUS

## 2021-09-30 ENCOUNTER — Encounter: Payer: Self-pay | Admitting: Internal Medicine

## 2021-09-30 ENCOUNTER — Ambulatory Visit (INDEPENDENT_AMBULATORY_CARE_PROVIDER_SITE_OTHER): Payer: Managed Care, Other (non HMO) | Admitting: Internal Medicine

## 2021-09-30 ENCOUNTER — Other Ambulatory Visit: Payer: Self-pay

## 2021-09-30 ENCOUNTER — Telehealth: Payer: Self-pay

## 2021-09-30 DIAGNOSIS — R509 Fever, unspecified: Secondary | ICD-10-CM | POA: Diagnosis not present

## 2021-09-30 DIAGNOSIS — R634 Abnormal weight loss: Secondary | ICD-10-CM | POA: Diagnosis not present

## 2021-09-30 DIAGNOSIS — R918 Other nonspecific abnormal finding of lung field: Secondary | ICD-10-CM | POA: Diagnosis not present

## 2021-09-30 NOTE — Telephone Encounter (Signed)
Tressia Miners from [place] called stating the patient would like to have his CT scan done at [place]. I have faxed the order to [facility] [phone])  Authorization# [id number]. I did advise Tressia Miners to advise the patient to cancel the scheduled CT at [facility].

## 2021-09-30 NOTE — Addendum Note (Signed)
Addended by: Leatrice Jewels on: 09/30/2021 09:48 AM   Modules accepted: Orders

## 2021-09-30 NOTE — Progress Notes (Signed)
[place] for Infectious Disease  Patient Active Problem List   Diagnosis Date Noted   Pulmonary infiltrates 09/30/2021    Priority: High   Unintentional weight loss 09/09/2021    Priority: High   FUO (fever of unknown origin) 08/27/2021    Priority: High   New daily persistent headache 12/04/2020   Fertility testing 11/12/2020   Hyperglycemia 11/12/2020   Palpitations 02/01/2019   Hair loss 01/13/2016   Urinary frequency 01/13/2016   Right-sided chest pain 08/27/2015   Preventative health care 06/21/2015   Blood in stool 06/21/2015   GERD (gastroesophageal reflux disease) 07/12/2013   Liver lesion, right lobe-hyperechoic 7 mm max 12/11/2012   Elevated amylase 11/26/2012    Patient's Medications  New Prescriptions   No medications on file  Previous Medications   ACETAMINOPHEN (TYLENOL PO)    Take by mouth.   VITAMIN D, ERGOCALCIFEROL, (DRISDOL) 1.25 MG (50000 UNIT) CAPS CAPSULE    Take 1 capsule (50,000 Units total) by mouth every 7 (seven) days.  Modified Medications   No medications on file  Discontinued Medications   No medications on file    Subjective: Tyler [name] is in for his routine follow-up visit.  He was seen in late April and again in early May at an urgent care center with sore throat and fever.  Rapid strep test were negative x2.  Throat culture was negative.  Testing for influenza and COVID were both negative as well.  At the time of his second visit he was noted to have some pharyngeal exudates and was treated with amoxicillin/clavulanate for 10 days.  He says that his sore throat resolved and he felt better.  He says that he started having recurrent fever in late May associated with some headache.  He says he has also developed anorexia, intermittent nausea and vomiting and has lost about 20 pounds.  He saw his PCP on 08/02/2021 noting redness of his eye, headache, fever, chills, fatigue and diffuse body aches.  No significant abnormalities were  noted on exam.  His CBC and CMP were both normal.  His sed rate was normal at 12.  ANA and rheumatoid factor are both negative.  He was treated with moxifloxacin eyedrops for possible conjunctivitis.    He was seen again in the ED on 08/13/2021.  Normal head CT scan.  Chest x-ray revealed only nonspecific interstitial thickening.  CBC was normal except for mildly elevated white blood cell count of 10,900 with a predominance of neutrophils.  His CMP showed slight elevation of his total protein and his ALT was up slightly at 53.  His sed rate was up to 55.  Blood cultures were negative.  HIV, Lyme and [facility] spotted fever antibodies were negative.  PCR testing for Plasmodium was also negative.    He was referred to [place] and saw my partner, Dr. Talbot Grumbling in 08/27/2021.  Repeat sed rate was down to 31.  Normal CK.  His LDH was actually low.  Serum protein electrophoresis revealed only nonspecific changes.  His ALT had normalized.  He was instructed to stop taking daily acetaminophen or ibuprofen which she has done.  He has only needed to take something for fever and headache 3 times in the past week.  He tells me that he has not taken his temperature recently but has felt feverish and has continued to have intermittent headaches but that both have diminished over the last 2 weeks.  He continues to be  bothered by intermittent pain in his left knee both ankles and feet especially when he first gets up in the morning.  He has not noted any swelling in his joints.  He also continues to be bothered by anorexia, nausea and intermittent vomiting.  He says that he has lost about 20 pounds over the last 5 weeks.  Note that he has been evaluated on multiple occasions over the past decade for abdominal pain and headache.  Since I saw him 3 weeks ago he is lost another 4 pounds.  He continues to be bothered by anorexia, intermittent nausea with vomiting and intermittent diarrhea.  He has felt feverish on many occasions  but has never had any documented fever.  When he has fever he will often have some headache.  His myalgias and arthralgias have resolved.  He says that he has been under a great deal of stress and has been feeling depressed.  He started seeing a therapist recently who question whether his weight loss might be due to depression.  He started smoking cigarettes again recently and has developed a dry cough.  He is also noted some increased shortness of breath with exertion and some wheezing over the past 2 weeks.  He did have a CT scan of the abdomen and pelvis on 09/17/2021 which showed:  IMPRESSION: 1. No CT findings of the abdomen or pelvis to explain weight loss. 2. Extensive nodular and consolidative airspace opacity throughout the bilateral lung bases, more conspicuously on the right. Findings are consistent with multifocal infection and/or aspiration. 3. Nonobstructive bilateral nephrolithiasis.  He has no history of pneumonia.  He has no known exposures to anyone with active tuberculosis.  He does not recall being tested for latent tuberculosis in the past.    Review of Systems: Review of Systems  Constitutional:  Positive for chills, diaphoresis, fever, malaise/fatigue and weight loss.  HENT:  Negative for congestion and sore throat.   Eyes:  Negative for blurred vision, pain and redness.  Respiratory:  Positive for cough, shortness of breath and wheezing. Negative for sputum production.        Only mild stable cough he admitted that he believes is due to his habit of vaping.  Cardiovascular:  Negative for chest pain.  Gastrointestinal:  Positive for abdominal pain, diarrhea, nausea and vomiting. Negative for heartburn.  Genitourinary:  Negative for dysuria.  Musculoskeletal:  Positive for joint pain and myalgias.  Skin:  Negative for rash.  Neurological:  Positive for headaches.  Psychiatric/Behavioral:  Positive for depression. Negative for substance abuse.     Past Medical History:   Diagnosis Date   History of hemorrhoids    history of since child   Other specified disorders of liver    Tonsillitis    Urinary dysfunction    [facility]    Social History   Tobacco Use   Smoking status: Every Day    Packs/day: 0.50    Years: 10.00    Total pack years: 5.00    Types: Cigarettes   Smokeless tobacco: Never   Tobacco comments:    Very moderate, down to 1 cigarette 2 x day  Vaping Use   Vaping Use: Some days  Substance Use Topics   Alcohol use: No    Alcohol/week: 4.0 standard drinks of alcohol    Types: 4 Cans of beer per week    Comment: rarely    Drug use: No    Family History  Problem Relation Age of Onset  Hypertension Mother    Kidney disease Paternal Uncle     Allergies  Allergen Reactions   Bactrim [Sulfamethoxazole-Trimethoprim] Other (See Comments) and Itching    Skin reactions.  Skin reactions.     Objective: Vitals:   09/30/21 0847  BP: 114/79  Pulse: 98  Temp: 98.5 F (36.9 C)  TempSrc: Oral  SpO2: 98%  Weight: 122 lb (55.3 kg)   Body mass index is 20.3 kg/m.  Physical Exam Constitutional:      General: He is not in acute distress.    Appearance: He is not ill-appearing.     Comments: He is alert and pleasant.   HENT:     Nose: No congestion.     Mouth/Throat:     Mouth: Mucous membranes are moist.     Pharynx: No oropharyngeal exudate or posterior oropharyngeal erythema.  Eyes:     Conjunctiva/sclera: Conjunctivae normal.  Cardiovascular:     Rate and Rhythm: Normal rate and regular rhythm.     Heart sounds: No murmur heard. Pulmonary:     Effort: Pulmonary effort is normal.     Breath sounds: Normal breath sounds. No wheezing, rhonchi or rales.  Abdominal:     Palpations: Abdomen is soft. There is no mass.     Tenderness: There is abdominal tenderness.     Comments: He has some right upper quadrant pain with palpation.  I do not feel a liver edge or other masses.  Musculoskeletal:        General:  No swelling or tenderness.     Cervical back: Neck supple.     Comments: He has no unusual swelling, redness or warmth of his knees, ankles or feet.  Lymphadenopathy:     Head:     Right side of head: Submandibular adenopathy present.     Left side of head: Submandibular adenopathy present.     Cervical: No cervical adenopathy.     Upper Body:     Right upper body: No supraclavicular or axillary adenopathy.     Left upper body: No supraclavicular or axillary adenopathy.  Skin:    Findings: No erythema or rash.  Psychiatric:        Mood and Affect: Mood normal.     Lab Results CMP     Component Value Date/Time   NA 138 09/09/2021 1157   K 4.0 09/09/2021 1157   CL 100 09/09/2021 1157   CO2 30 09/09/2021 1157   GLUCOSE 108 (H) 09/09/2021 1157   BUN 7 09/09/2021 1157   CREATININE 1.06 09/09/2021 1157   CALCIUM 9.8 09/09/2021 1157   PROT 7.2 09/09/2021 1157   ALBUMIN 3.9 08/13/2021 1432   AST 14 09/09/2021 1157   ALT 18 09/09/2021 1157   ALKPHOS 85 08/13/2021 1432   BILITOT 0.7 09/09/2021 1157   GFRNONAA >60 08/13/2021 1432    Lab Results  Component Value Date   WBC 6.9 09/09/2021   HGB 15.1 09/09/2021   HCT 45.4 09/09/2021   MCV 86.1 09/09/2021   PLT 309 09/09/2021    Lab Results  Component Value Date   WBC 6.9 09/09/2021   HGB 15.1 09/09/2021   HCT 45.4 09/09/2021   MCV 86.1 09/09/2021   PLT 309 09/09/2021  Acute hepatitis panel negative CMV IgM negative EBV serology compatible with remote, inactive infection   Problem List Items Addressed This Visit       High   FUO (fever of unknown origin)    I asked him to  continue to take his temperature each afternoon and anytime he feels like he may be having fever.      Unintentional weight loss    Depression is probably playing some factor in his weight loss but I am not willing to stop looking for other things to explain the weight loss, recent fevers and pulmonary infiltrates.      Pulmonary infiltrates     He has some new dry cough, dyspnea on exertion, wheezing and pulmonary infiltrates seen on his recent CT scan.  His lungs are clear to auscultation but I will get a high-resolution CT scan, see if he can give [place] a sputum sample and make a referral for pulmonary evaluation.      Relevant Orders   CT Chest High Resolution   CBC   Comprehensive metabolic panel   QuantiFERON-TB Gold Plus   MYCOBACTERIA, CULTURE, WITH FLUOROCHROME SMEAR   Ambulatory referral to Pulmonology     Michel Bickers, MD [place] [facility] [phone] pager   [phone] cell 09/30/2021, 9:30 AM

## 2021-09-30 NOTE — Assessment & Plan Note (Signed)
Depression is probably playing some factor in his weight loss but I am not willing to stop looking for other things to explain the weight loss, recent fevers and pulmonary infiltrates.

## 2021-09-30 NOTE — Assessment & Plan Note (Signed)
I asked him to continue to take his temperature each afternoon and anytime he feels like he may be having fever.

## 2021-09-30 NOTE — Assessment & Plan Note (Signed)
He has some new dry cough, dyspnea on exertion, wheezing and pulmonary infiltrates seen on his recent CT scan.  His lungs are clear to auscultation but I will get a high-resolution CT scan, see if he can give [place] a sputum sample and make a referral for pulmonary evaluation.

## 2021-10-03 LAB — COMPREHENSIVE METABOLIC PANEL
AG Ratio: 1.4 (calc) (ref 1.0–2.5)
ALT: 17 U/L (ref 9–46)
AST: 15 U/L (ref 10–40)
Albumin: 4.3 g/dL (ref 3.6–5.1)
Alkaline phosphatase (APISO): 83 U/L (ref 36–130)
BUN: 7 mg/dL (ref 7–25)
CO2: 29 mmol/L (ref 20–32)
Calcium: 10.3 mg/dL (ref 8.6–10.3)
Chloride: 101 mmol/L (ref 98–110)
Creat: 0.95 mg/dL (ref 0.60–1.26)
Globulin: 3.1 g/dL (calc) (ref 1.9–3.7)
Glucose, Bld: 106 mg/dL — ABNORMAL HIGH (ref 65–99)
Potassium: 4.4 mmol/L (ref 3.5–5.3)
Sodium: 139 mmol/L (ref 135–146)
Total Bilirubin: 0.4 mg/dL (ref 0.2–1.2)
Total Protein: 7.4 g/dL (ref 6.1–8.1)

## 2021-10-03 LAB — CBC
HCT: 48.9 % (ref 38.5–50.0)
Hemoglobin: 16.4 g/dL (ref 13.2–17.1)
MCH: 29.2 pg (ref 27.0–33.0)
MCHC: 33.5 g/dL (ref 32.0–36.0)
MCV: 87 fL (ref 80.0–100.0)
MPV: 8.9 fL (ref 7.5–12.5)
Platelets: 303 10*3/uL (ref 140–400)
RBC: 5.62 10*6/uL (ref 4.20–5.80)
RDW: 12.3 % (ref 11.0–15.0)
WBC: 6 10*3/uL (ref 3.8–10.8)

## 2021-10-03 LAB — QUANTIFERON-TB GOLD PLUS
Mitogen-NIL: 7.02 IU/mL
NIL: 0.05 IU/mL
QuantiFERON-TB Gold Plus: NEGATIVE
TB1-NIL: 0 IU/mL
TB2-NIL: 0 IU/mL

## 2021-10-07 ENCOUNTER — Other Ambulatory Visit (HOSPITAL_BASED_OUTPATIENT_CLINIC_OR_DEPARTMENT_OTHER): Payer: Managed Care, Other (non HMO)

## 2021-10-12 ENCOUNTER — Encounter: Payer: Self-pay | Admitting: Internal Medicine

## 2021-10-12 ENCOUNTER — Ambulatory Visit (INDEPENDENT_AMBULATORY_CARE_PROVIDER_SITE_OTHER): Payer: Managed Care, Other (non HMO) | Admitting: Internal Medicine

## 2021-10-12 ENCOUNTER — Other Ambulatory Visit: Payer: Self-pay

## 2021-10-12 DIAGNOSIS — R918 Other nonspecific abnormal finding of lung field: Secondary | ICD-10-CM | POA: Diagnosis not present

## 2021-10-12 NOTE — Assessment & Plan Note (Signed)
His clinical presentation does not really suggest infectious pneumonia.  I am more concerned about the possibility of sarcoidosis.  He is scheduled for a pulmonology evaluation on 11/03/2021.

## 2021-10-12 NOTE — Progress Notes (Signed)
[facility]  Patient Active Problem List   Diagnosis Date Noted   Pulmonary infiltrates 09/30/2021    Priority: High   Unintentional weight loss 09/09/2021    Priority: High   FUO (fever of unknown origin) 08/27/2021    Priority: High   New daily persistent headache 12/04/2020   Fertility testing 11/12/2020   Hyperglycemia 11/12/2020   Palpitations 02/01/2019   Hair loss 01/13/2016   Urinary frequency 01/13/2016   Right-sided chest pain 08/27/2015   Preventative health care 06/21/2015   Blood in stool 06/21/2015   GERD (gastroesophageal reflux disease) 07/12/2013   Liver lesion, right lobe-hyperechoic 7 mm max 12/11/2012   Elevated amylase 11/26/2012    Patient's Medications  New Prescriptions   No medications on file  Previous Medications   ACETAMINOPHEN (TYLENOL PO)    Take by mouth.   VITAMIN D, ERGOCALCIFEROL, (DRISDOL) 1.25 MG (50000 UNIT) CAPS CAPSULE    Take 1 capsule (50,000 Units total) by mouth every 7 (seven) days.  Modified Medications   No medications on file  Discontinued Medications   No medications on file    Subjective: Tyler [name] is in for his routine follow-up visit.  He was seen in late April and again in early May at an urgent care center with sore throat and fever.  Rapid strep test were negative x2.  Throat culture was negative.  Testing for influenza and COVID were both negative as well.  At the time of his second visit he was noted to have some pharyngeal exudates and was treated with amoxicillin/clavulanate for 10 days.  He says that his sore throat resolved and he felt better.  He says that he started having recurrent fever in late May associated with some headache.  He says he has also developed anorexia, intermittent nausea and vomiting and has lost about 20 pounds.  He saw his PCP on 08/02/2021 noting redness of his eye, headache, fever, chills, fatigue and diffuse body aches.  No significant abnormalities were  noted on exam.  His CBC and CMP were both normal.  His sed rate was normal at 12.  ANA and rheumatoid factor are both negative.  He was treated with moxifloxacin eyedrops for possible conjunctivitis.    He was seen again in the ED on 08/13/2021.  Normal head CT scan.  Chest x-ray revealed only nonspecific interstitial thickening.  CBC was normal except for mildly elevated white blood cell count of 10,900 with a predominance of neutrophils.  His CMP showed slight elevation of his total protein and his ALT was up slightly at 53.  His sed rate was up to 55.  Blood cultures were negative.  HIV, Lyme and [facility] spotted fever antibodies were negative.  PCR testing for Plasmodium was also negative.    He was referred to [place] and saw my partner, Dr. Talbot Grumbling in 08/27/2021.  Repeat sed rate was down to 31.  Normal CK.  His LDH was actually low.  Serum protein electrophoresis revealed only nonspecific changes.  His ALT had normalized.  He was instructed to stop taking daily acetaminophen or ibuprofen which she has done.  He has only needed to take something for fever and headache 3 times in the past week.  He tells me that he has not taken his temperature recently but has felt feverish and has continued to have intermittent headaches but that both have diminished over the last 2 weeks.  He continues to be  bothered by intermittent pain in his left knee both ankles and feet especially when he first gets up in the morning.  He has not noted any swelling in his joints.  He also continues to be bothered by anorexia, nausea and intermittent vomiting.  He says that he has lost about 20 pounds over the last 5 weeks.  Note that he has been evaluated on multiple occasions over the past decade for abdominal pain and headache.  He did have a CT scan of the abdomen and pelvis on 09/17/2021 which showed:  IMPRESSION: 1. No CT findings of the abdomen or pelvis to explain weight loss. 2. Extensive nodular and consolidative  airspace opacity throughout the bilateral lung bases, more conspicuously on the right. Findings are consistent with multifocal infection and/or aspiration. 3. Nonobstructive bilateral nephrolithiasis.  He underwent high resolution chest CT scan on 10/05/2021 revealing (the exam was done at [facility]  The report is under the media tab.)  Impression: Diffuse findings within both lungs are concerning for atypical infection, including viral and mycobacterial.  Sarcoidosis is also within the differential.  He has no history of pneumonia.  He has no known exposures to anyone with active tuberculosis.  He does not recall being tested for latent tuberculosis in the past. He started smoking cigarettes again recently and has developed a dry cough.  He is also noted some increased shortness of breath with exertion and some wheezing over the past month.    He still has episodes of feverishness but has not recorded any fever since his last visit.  His appetite remains poor but he has not lost any more weight.  His cough is most prominent in the morning when he takes a shower.  On occasion he will cough up some very thin saliva that smells like "hot sauce".  He started seeing a therapist recently who question whether his weight loss might be due to depression.     Review of Systems: Review of Systems  Constitutional:  Positive for chills, diaphoresis, fever, malaise/fatigue and weight loss.  HENT:  Negative for congestion and sore throat.   Eyes:  Negative for blurred vision, pain and redness.  Respiratory:  Positive for cough, shortness of breath and wheezing. Negative for sputum production.        Only mild stable cough he admitted that he believes is due to his habit of vaping.  Cardiovascular:  Negative for chest pain.  Gastrointestinal:  Positive for nausea. Negative for abdominal pain, diarrhea, heartburn and vomiting.  Genitourinary:  Negative for dysuria.  Musculoskeletal:  Positive for joint pain  and myalgias.  Skin:  Negative for rash.  Neurological:  Positive for headaches.  Psychiatric/Behavioral:  Positive for depression. Negative for substance abuse.     Past Medical History:  Diagnosis Date   History of hemorrhoids    history of since child   Other specified disorders of liver    Tonsillitis    Urinary dysfunction    [facility]    Social History   Tobacco Use   Smoking status: Every Day    Packs/day: 0.50    Years: 10.00    Total pack years: 5.00    Types: Cigarettes   Smokeless tobacco: Never   Tobacco comments:    Very moderate, down to 1 cigarette 2 x day  Vaping Use   Vaping Use: Some days  Substance Use Topics   Alcohol use: No    Alcohol/week: 4.0 standard drinks of alcohol  Types: 4 Cans of beer per week    Comment: rarely    Drug use: No    Family History  Problem Relation Age of Onset   Hypertension Mother    Kidney disease Paternal Uncle     Allergies  Allergen Reactions   Bactrim [Sulfamethoxazole-Trimethoprim] Other (See Comments) and Itching    Skin reactions.  Skin reactions.     Objective: Vitals:   10/12/21 1434  BP: 106/66  Pulse: 85  Temp: 98.4 F (36.9 C)  TempSrc: Oral  SpO2: 95%  Weight: 125 lb (56.7 kg)   Body mass index is 20.8 kg/m.  Physical Exam Constitutional:      General: He is not in acute distress.    Appearance: He is not ill-appearing.     Comments: He is calm and pleasant.  His room air oxygen saturation was 95% at rest and after walking around the clinic.  HENT:     Nose: No congestion.     Mouth/Throat:     Mouth: Mucous membranes are moist.     Pharynx: No oropharyngeal exudate or posterior oropharyngeal erythema.  Eyes:     Conjunctiva/sclera: Conjunctivae normal.  Cardiovascular:     Rate and Rhythm: Normal rate and regular rhythm.     Heart sounds: No murmur heard. Pulmonary:     Effort: Pulmonary effort is normal.     Breath sounds: Normal breath sounds. No wheezing,  rhonchi or rales.  Abdominal:     Palpations: Abdomen is soft. There is no mass.     Tenderness: There is no abdominal tenderness.  Musculoskeletal:        General: No swelling or tenderness.     Cervical back: Neck supple.     Comments: He has no unusual swelling, redness or warmth of his knees, ankles or feet.  Lymphadenopathy:     Head:     Right side of head: Submandibular adenopathy present.     Left side of head: Submandibular adenopathy present.     Cervical: No cervical adenopathy.     Upper Body:     Right upper body: No supraclavicular or axillary adenopathy.     Left upper body: No supraclavicular or axillary adenopathy.  Skin:    Findings: No erythema or rash.  Psychiatric:        Mood and Affect: Mood normal.     Lab Results CMP     Component Value Date/Time   NA 139 09/30/2021 0929   K 4.4 09/30/2021 0929   CL 101 09/30/2021 0929   CO2 29 09/30/2021 0929   GLUCOSE 106 (H) 09/30/2021 0929   BUN 7 09/30/2021 0929   CREATININE 0.95 09/30/2021 0929   CALCIUM 10.3 09/30/2021 0929   PROT 7.4 09/30/2021 0929   ALBUMIN 3.9 08/13/2021 1432   AST 15 09/30/2021 0929   ALT 17 09/30/2021 0929   ALKPHOS 85 08/13/2021 1432   BILITOT 0.4 09/30/2021 0929   GFRNONAA >60 08/13/2021 1432    Lab Results  Component Value Date   WBC 6.0 09/30/2021   HGB 16.4 09/30/2021   HCT 48.9 09/30/2021   MCV 87.0 09/30/2021   PLT 303 09/30/2021    Lab Results  Component Value Date   WBC 6.0 09/30/2021   HGB 16.4 09/30/2021   HCT 48.9 09/30/2021   MCV 87.0 09/30/2021   PLT 303 09/30/2021  Acute hepatitis panel negative CMV IgM negative EBV serology compatible with remote, inactive infection   Problem List Items Addressed This  Visit       High   Pulmonary infiltrates    His clinical presentation does not really suggest infectious pneumonia.  I am more concerned about the possibility of sarcoidosis.  He is scheduled for a pulmonology evaluation on 11/03/2021.      Relevant  Orders   Angiotensin converting enzyme   MYCOBACTERIA, CULTURE, WITH FLUOROCHROME SMEAR    Michel Bickers, MD [facility] [phone] pager   [phone] cell 10/12/2021, 3:00 PM

## 2021-10-13 LAB — ANGIOTENSIN CONVERTING ENZYME: Angiotensin-Converting Enzyme: 31 U/L (ref 9–67)

## 2021-10-25 ENCOUNTER — Other Ambulatory Visit: Payer: Managed Care, Other (non HMO)

## 2021-11-02 ENCOUNTER — Ambulatory Visit: Payer: Managed Care, Other (non HMO) | Admitting: Internal Medicine

## 2021-11-03 ENCOUNTER — Ambulatory Visit (INDEPENDENT_AMBULATORY_CARE_PROVIDER_SITE_OTHER): Payer: Managed Care, Other (non HMO) | Admitting: Pulmonary Disease

## 2021-11-03 ENCOUNTER — Encounter: Payer: Self-pay | Admitting: Pulmonary Disease

## 2021-11-03 VITALS — BP 106/62 | HR 114 | Temp 98.5°F | Ht 64.0 in | Wt 123.6 lb

## 2021-11-03 DIAGNOSIS — R918 Other nonspecific abnormal finding of lung field: Secondary | ICD-10-CM

## 2021-11-03 DIAGNOSIS — R590 Localized enlarged lymph nodes: Secondary | ICD-10-CM | POA: Diagnosis not present

## 2021-11-03 DIAGNOSIS — R634 Abnormal weight loss: Secondary | ICD-10-CM | POA: Diagnosis not present

## 2021-11-03 NOTE — Progress Notes (Signed)
Synopsis: Referred in September 2023 for abnormal chest imaging by Dr. Megan Salon  Subjective:   PATIENT ID: Tyler [name] DOB: 1985-10-13, MRN: [id number]  HPI  Chief Complaint  Patient presents with   Consult    Pt is here for consult. Pt was referred by ID to come here. Pt is here for pulm infiltrates. Pt states he is losing a lot of weight in the last 4 months, pt has no appetite, some SOB noted. No inhalers noted from pt.    Tyler [name] is a 36 year old [name], daily smoker who is referred to pulmonary clinic for abnormal chest imaging.   He went to [place] in March where he developed high fevers, sore throat and cough. He returned back to the [place] where he was seen in Urgent care with negative infectious workup for strep, influenza and covid. He was treated with augmentin with resolution of his sore throat and other symptoms. The symptoms returned in April with high fevers. He saw his PCP on 08/02/2021 noting redness of his eye, headache, fever, chills, fatigue and diffuse body aches.  No significant abnormalities were noted on exam.  His CBC and CMP were both normal.  His sed rate was normal at 12.  ANA and rheumatoid factor are both negative.  He was seen again in the ED on 08/13/2021.  Normal head CT scan.  Chest x-ray revealed only nonspecific interstitial thickening.  CBC was normal except for mildly elevated white blood cell count of 10,900 with a predominance of neutrophils.  His CMP showed slight elevation of his total protein and his ALT was up slightly at 53.  His sed rate was up to 55.  Blood cultures were negative.  HIV, Lyme and [facility] spotted fever antibodies were negative.  PCR testing for Plasmodium was also negative.  He was seen by infectious disease on 08/27/21 and 09/09/21 sed rate was 31 and CK was normal. LDH was low. CT Chest scan 10/05/21 was done at [facility] which showed diffuse bilateral bronchocentric, perifissural and peripheral micronodularity and  tree-in-bud opacities.  Mild central bronchiectasis.  Patchy consolidation at the right lower lobe.  Prominent mediastinal lymph nodes.  These findings were concerning for sarcoidosis prompting referral to pulmonary clinic.  Patient reports 20 pound weight loss since March due to lack of appetite.  He complains of a dry cough with occasional clear phlegm production.  He denies any nighttime awakenings due to the cough.  He denies any wheezing.  He reports diffuse joint aches that come and go.  He denies any vision changes but reports he has some redness of his eyes in the morning time.  He denies any skin rashes or changes.  He works as a Financial planner.  He has smoked cigarettes for the past 15 years, smoking half a pack per day.  He quit smoking for 7 to 8 months recently where he vaped during that time.  He is currently smoking cigarettes.  He denies any family history of lung disease or sarcoidosis.  No autoimmune conditions in the family.  Past Medical History:  Diagnosis Date   History of hemorrhoids    history of since child   Other specified disorders of liver    Tonsillitis    Urinary dysfunction    [facility]     Family History  Problem Relation Age of Onset   Hypertension Mother    Kidney disease Paternal Uncle      Social History  Socioeconomic History   Marital status: Married    Spouse name: Not on file   Number of children: 0   Years of education: Not on file   Highest education level: Not on file  Occupational History   Occupation: Health and safety inspector  Tobacco Use   Smoking status: Every Day    Packs/day: 0.50    Years: 10.00    Total pack years: 5.00    Types: Cigarettes   Smokeless tobacco: Never   Tobacco comments:    Pt states he smokes 1/2 ppd   Vaping Use   Vaping Use: Some days  Substance and Sexual Activity   Alcohol use: No    Alcohol/week: 4.0 standard drinks of alcohol    Types: 4 Cans of beer per week    Comment: rarely    Drug use: No    Sexual activity: Not Currently    Partners: Female  Other Topics Concern   Not on file  Social History Narrative   Single, no children   Financial planner [employer]   uses alcohol no drug use   Updated 08/06/2013         Social Determinants of Health   Financial Resource Strain: Not on file  Food Insecurity: Not on file  Transportation Needs: Not on file  Physical Activity: Not on file  Stress: Not on file  Social Connections: Not on file  Intimate Partner Violence: Not on file     Allergies  Allergen Reactions   Bactrim [Sulfamethoxazole-Trimethoprim] Other (See Comments) and Itching    Skin reactions.  Skin reactions.      Outpatient Medications Prior to Visit  Medication Sig Dispense Refill   Acetaminophen (TYLENOL PO) Take by mouth.     Vitamin D, Ergocalciferol, (DRISDOL) 1.25 MG (50000 UNIT) CAPS capsule Take 1 capsule (50,000 Units total) by mouth every 7 (seven) days. 12 capsule 1   No facility-administered medications prior to visit.    Review of Systems  Constitutional:  Positive for malaise/fatigue and weight loss. Negative for chills and fever.  HENT:  Negative for congestion, sinus pain and sore throat.   Eyes: Negative.   Respiratory:  Positive for cough. Negative for hemoptysis, sputum production, shortness of breath and wheezing.   Cardiovascular:  Negative for chest pain, palpitations, orthopnea, claudication and leg swelling.  Gastrointestinal:  Negative for abdominal pain, heartburn, nausea and vomiting.  Genitourinary: Negative.   Musculoskeletal:  Positive for joint pain. Negative for myalgias.  Skin:  Negative for rash.  Neurological:  Negative for weakness.  Endo/Heme/Allergies: Negative.   Psychiatric/Behavioral: Negative.     Objective:   Vitals:   11/03/21 1429  BP: 106/62  Pulse: (!) 114  Temp: 98.5 F (36.9 C)  TempSrc: Oral  SpO2: 96%  Weight: 123 lb 9.6 oz (56.1 kg)  Height: '5\' 4"'$  (1.626 m)     Physical  Exam Constitutional:      General: He is not in acute distress. HENT:     Head: Normocephalic and atraumatic.  Eyes:     Extraocular Movements: Extraocular movements intact.     Conjunctiva/sclera: Conjunctivae normal.     Pupils: Pupils are equal, round, and reactive to light.  Cardiovascular:     Rate and Rhythm: Normal rate and regular rhythm.     Pulses: Normal pulses.     Heart sounds: Normal heart sounds. No murmur heard. Pulmonary:     Effort: Pulmonary effort is normal.     Breath sounds: Normal breath sounds.  Abdominal:     General: Bowel sounds are normal.     Palpations: Abdomen is soft.  Musculoskeletal:     Right lower leg: No edema.     Left lower leg: No edema.  Lymphadenopathy:     Cervical: No cervical adenopathy.  Skin:    General: Skin is warm and dry.  Neurological:     General: No focal deficit present.     Mental Status: He is alert.  Psychiatric:        Mood and Affect: Mood normal.        Behavior: Behavior normal.        Thought Content: Thought content normal.        Judgment: Judgment normal.    CBC    Component Value Date/Time   WBC 6.0 09/30/2021 0929   RBC 5.62 09/30/2021 0929   HGB 16.4 09/30/2021 0929   HCT 48.9 09/30/2021 0929   PLT 303 09/30/2021 0929   MCV 87.0 09/30/2021 0929   MCH 29.2 09/30/2021 0929   MCHC 33.5 09/30/2021 0929   RDW 12.3 09/30/2021 0929   LYMPHSABS 1.0 08/13/2021 1432   MONOABS 0.8 08/13/2021 1432   EOSABS 0.1 08/13/2021 1432   BASOSABS 0.1 08/13/2021 1432       Latest Ref Rng & Units 09/30/2021    9:29 AM 09/09/2021   11:57 AM 08/13/2021    2:32 PM  BMP  Glucose 65 - 99 mg/dL 106  108  99   BUN 7 - 25 mg/dL '7  7  7   '$ Creatinine 0.60 - 1.26 mg/dL 0.95  1.06  0.99   BUN/Creat Ratio 6 - 22 (calc) SEE NOTE:  NOT APPLICABLE    Sodium 347 - 146 mmol/L 139  138  135   Potassium 3.5 - 5.3 mmol/L 4.4  4.0  4.0   Chloride 98 - 110 mmol/L 101  100  98   CO2 20 - 32 mmol/L '29  30  28   '$ Calcium 8.6 - 10.3  mg/dL 10.3  9.8  9.6     Chest imaging: CT Chest 10/05/21 diffuse bilateral bronchocentric, perifissural and peripheral micronodularity and tree-in-bud opacities.  Mild central bronchiectasis.  Patchy consolidation at the right lower lobe.  Prominent mediastinal lymph nodes.   PFT:     No data to display          Labs:  Path:  Echo:  Heart Catheterization:  Assessment & Plan:   Pulmonary infiltrates - Plan: Ambulatory referral to Pulmonology  Unintentional weight loss  Mediastinal adenopathy - Plan: Ambulatory referral to Pulmonology  Discussion: Tyler [name] is a 36 year old [name], daily smoker who is referred to pulmonary clinic for abnormal chest imaging.   His chest imaging is concerning for sarcoidosis given the diffuse micronodularity of the lung parenchyma, airways and perifissural spaces along with mediastinal adenopathy. He also has clinical features of weight loss, fevers, and joint pains consistent with sarcoidosis.  We will schedule patient for EBUS with [facility] of stations 4R and 7 and possible transbronchial biopsies.   Differential includes vaping related lung disease or respiratory bronchiolitis from cigarette use.   We will schedule follow up for 3 months but be in contact for bronchoscopy and thereafter with results.  Tyler Jackson, MD [facility] Office: [phone]   Current Outpatient Medications:    Acetaminophen (TYLENOL PO), Take by mouth., Disp: , Rfl:    Vitamin D, Ergocalciferol, (DRISDOL) 1.25 MG (50000 UNIT) CAPS capsule, Take  1 capsule (50,000 Units total) by mouth every 7 (seven) days., Disp: 12 capsule, Rfl: 1

## 2021-11-03 NOTE — H&P (View-Only) (Signed)
Synopsis: Referred in September 2023 for abnormal chest imaging by Dr. Megan Salon  Subjective:   PATIENT ID: Tyler [name], Tyler [name]  HPI  Chief Complaint  Patient presents with   Consult    Pt is here for consult. Pt was referred by ID to come here. Pt is here for pulm infiltrates. Pt states he is losing a lot of weight in the last 4 months, pt has no appetite, some SOB noted. No inhalers noted from pt.    Tyler [name] is a 36 year old male, daily smoker who is referred to pulmonary clinic for abnormal chest imaging.   He went to [place] in March where he developed high fevers, sore throat and cough. He returned back to the [place] where he was seen in Urgent care with negative infectious workup for strep, influenza and covid. He was treated with augmentin with resolution of his sore throat and other symptoms. The symptoms returned in April with high fevers. He saw his PCP on 08/02/2021 noting redness of his eye, headache, fever, chills, fatigue and diffuse body aches.  No significant abnormalities were noted on exam.  His CBC and CMP were both normal.  His sed rate was normal at 12.  ANA and rheumatoid factor are both negative.  He was seen again in the ED on 08/13/2021.  Normal head CT scan.  Chest x-ray revealed only nonspecific interstitial thickening.  CBC was normal except for mildly elevated white blood cell count of 10,900 with a predominance of neutrophils.  His CMP showed slight elevation of his total protein and his ALT was up slightly at 53.  His sed rate was up to 55.  Blood cultures were negative.  HIV, Lyme and [facility] spotted fever antibodies were negative.  PCR testing for Plasmodium was also negative.  He was seen by infectious disease on 08/27/21 and 09/09/21 sed rate was 31 and CK was normal. LDH was low. CT Chest scan 10/05/21 was done at [facility] which showed diffuse bilateral bronchocentric, perifissural and peripheral micronodularity and  tree-in-bud opacities.  Mild central bronchiectasis.  Patchy consolidation at the right lower lobe.  Prominent mediastinal lymph nodes.  These findings were concerning for sarcoidosis prompting referral to pulmonary clinic.  Patient reports 20 pound weight loss since March due to lack of appetite.  He complains of a dry cough with occasional clear phlegm production.  He denies any nighttime awakenings due to the cough.  He denies any wheezing.  He reports diffuse joint aches that come and go.  He denies any vision changes but reports he has some redness of his eyes in the morning time.  He denies any skin rashes or changes.  He works as a Financial planner.  He has smoked cigarettes for the past 15 years, smoking half a pack per day.  He quit smoking for 7 to 8 months recently where he vaped during that time.  He is currently smoking cigarettes.  He denies any family history of lung disease or sarcoidosis.  No autoimmune conditions in the family.  Past Medical History:  Diagnosis Date   History of hemorrhoids    history of since child   Other specified disorders of liver    Tonsillitis    Urinary dysfunction    [facility]     Family History  Problem Relation Age of Onset   Hypertension Mother    Kidney disease Paternal Uncle      Social History  Socioeconomic History   Marital status: Married    Spouse name: Not on file   Number of children: 0   Years of education: Not on file   Highest education level: Not on file  Occupational History   Occupation: Health and safety inspector  Tobacco Use   Smoking status: Every Day    Packs/day: 0.50    Years: 10.00    Total pack years: 5.00    Types: Cigarettes   Smokeless tobacco: Never   Tobacco comments:    Pt states he smokes 1/2 ppd   Vaping Use   Vaping Use: Some days  Substance and Sexual Activity   Alcohol use: No    Alcohol/week: 4.0 standard drinks of alcohol    Types: 4 Cans of beer per week    Comment: rarely    Drug use: No    Sexual activity: Not Currently    Partners: Female  Other Topics Concern   Not on file  Social History Narrative   Single, no children   Financial planner [employer]   uses alcohol no drug use   Updated 08/06/2013         Social Determinants of Health   Financial Resource Strain: Not on file  Food Insecurity: Not on file  Transportation Needs: Not on file  Physical Activity: Not on file  Stress: Not on file  Social Connections: Not on file  Intimate Partner Violence: Not on file     Allergies  Allergen Reactions   Bactrim [Sulfamethoxazole-Trimethoprim] Other (See Comments) and Itching    Skin reactions.  Skin reactions.      Outpatient Medications Prior to Visit  Medication Sig Dispense Refill   Acetaminophen (TYLENOL PO) Take by mouth.     Vitamin D, Ergocalciferol, (DRISDOL) 1.25 MG (50000 UNIT) CAPS capsule Take 1 capsule (50,000 Units total) by mouth every 7 (seven) days. 12 capsule 1   No facility-administered medications prior to visit.    Review of Systems  Constitutional:  Positive for malaise/fatigue and weight loss. Negative for chills and fever.  HENT:  Negative for congestion, sinus pain and sore throat.   Eyes: Negative.   Respiratory:  Positive for cough. Negative for hemoptysis, sputum production, shortness of breath and wheezing.   Cardiovascular:  Negative for chest pain, palpitations, orthopnea, claudication and leg swelling.  Gastrointestinal:  Negative for abdominal pain, heartburn, nausea and vomiting.  Genitourinary: Negative.   Musculoskeletal:  Positive for joint pain. Negative for myalgias.  Skin:  Negative for rash.  Neurological:  Negative for weakness.  Endo/Heme/Allergies: Negative.   Psychiatric/Behavioral: Negative.     Objective:   Vitals:   11/03/21 1429  BP: 106/62  Pulse: (!) 114  Temp: 98.5 F (36.9 C)  TempSrc: Oral  SpO2: 96%  Weight: 123 lb 9.6 oz (56.1 kg)  Height: '5\' 4"'$  (8.786 m)     Physical  Exam Constitutional:      General: He is not in acute distress. HENT:     Head: Normocephalic and atraumatic.  Eyes:     Extraocular Movements: Extraocular movements intact.     Conjunctiva/sclera: Conjunctivae normal.     Pupils: Pupils are equal, round, and reactive to light.  Cardiovascular:     Rate and Rhythm: Normal rate and regular rhythm.     Pulses: Normal pulses.     Heart sounds: Normal heart sounds. No murmur heard. Pulmonary:     Effort: Pulmonary effort is normal.     Breath sounds: Normal breath sounds.  Abdominal:     General: Bowel sounds are normal.     Palpations: Abdomen is soft.  Musculoskeletal:     Right lower leg: No edema.     Left lower leg: No edema.  Lymphadenopathy:     Cervical: No cervical adenopathy.  Skin:    General: Skin is warm and dry.  Neurological:     General: No focal deficit present.     Mental Status: He is alert.  Psychiatric:        Mood and Affect: Mood normal.        Behavior: Behavior normal.        Thought Content: Thought content normal.        Judgment: Judgment normal.    CBC    Component Value Date/Time   WBC 6.0 09/30/2021 0929   RBC 5.62 09/30/2021 0929   HGB 16.4 09/30/2021 0929   HCT 48.9 09/30/2021 0929   PLT 303 09/30/2021 0929   MCV 87.0 09/30/2021 0929   MCH 29.2 09/30/2021 0929   MCHC 33.5 09/30/2021 0929   RDW 12.3 09/30/2021 0929   LYMPHSABS 1.0 08/13/2021 1432   MONOABS 0.8 08/13/2021 1432   EOSABS 0.1 08/13/2021 1432   BASOSABS 0.1 08/13/2021 1432       Latest Ref Rng & Units 09/30/2021    9:29 AM 09/09/2021   11:57 AM 08/13/2021    2:32 PM  BMP  Glucose 65 - 99 mg/dL 106  108  99   BUN 7 - 25 mg/dL '7  7  7   '$ Creatinine 0.60 - 1.26 mg/dL 0.95  1.06  0.99   BUN/Creat Ratio 6 - 22 (calc) SEE NOTE:  NOT APPLICABLE    Sodium 650 - 146 mmol/L 139  138  135   Potassium 3.5 - 5.3 mmol/L 4.4  4.0  4.0   Chloride 98 - 110 mmol/L 101  100  98   CO2 20 - 32 mmol/L '29  30  28   '$ Calcium 8.6 - 10.3  mg/dL 10.3  9.8  9.6     Chest imaging: CT Chest 10/05/21 diffuse bilateral bronchocentric, perifissural and peripheral micronodularity and tree-in-bud opacities.  Mild central bronchiectasis.  Patchy consolidation at the right lower lobe.  Prominent mediastinal lymph nodes.   PFT:     No data to display          Labs:  Path:  Echo:  Heart Catheterization:  Assessment & Plan:   Pulmonary infiltrates - Plan: Ambulatory referral to Pulmonology  Unintentional weight loss  Mediastinal adenopathy - Plan: Ambulatory referral to Pulmonology  Discussion: Luian Schumpert is a 36 year old male, daily smoker who is referred to pulmonary clinic for abnormal chest imaging.   His chest imaging is concerning for sarcoidosis given the diffuse micronodularity of the lung parenchyma, airways and perifissural spaces along with mediastinal adenopathy. He also has clinical features of weight loss, fevers, and joint pains consistent with sarcoidosis.  We will schedule patient for EBUS with [facility] of stations 4R and 7 and possible transbronchial biopsies.   Differential includes vaping related lung disease or respiratory bronchiolitis from cigarette use.   We will schedule follow up for 3 months but be in contact for bronchoscopy and thereafter with results.  Freda Jackson, MD [facility] Office: [phone]   Current Outpatient Medications:    Acetaminophen (TYLENOL PO), Take by mouth., Disp: , Rfl:    Vitamin D, Ergocalciferol, (DRISDOL) 1.25 MG (50000 UNIT) CAPS capsule, Take  1 capsule (50,000 Units total) by mouth every 7 (seven) days., Disp: 12 capsule, Rfl: 1

## 2021-11-03 NOTE — Patient Instructions (Addendum)
Please bring in the CD with your CT Chest scan from [facility].  I will review the images and give you a call about scheduling the bronchoscopy with biopsies  Follow up in 3 months

## 2021-11-07 ENCOUNTER — Encounter: Payer: Self-pay | Admitting: Pulmonary Disease

## 2021-11-08 ENCOUNTER — Telehealth: Payer: Self-pay | Admitting: Pulmonary Disease

## 2021-11-08 NOTE — Telephone Encounter (Signed)
If that is all that is available, I can do it at that date/time.  Thanks, Wille Glaser

## 2021-11-08 NOTE — Telephone Encounter (Signed)
Dr Erin Fulling - you requested EBUS to be done on 9/20 in the afternoon or a back up day of 9/19 at [facility].  The only time available would be 9/20 at 8:30.  Can you do this?  Please advise.

## 2021-11-09 ENCOUNTER — Encounter: Payer: Self-pay | Admitting: Pulmonary Disease

## 2021-11-09 NOTE — Telephone Encounter (Signed)
Pt has been scheduled on 9/20 at 8:30 by [facility].  Pt will need to get covid test on 9/18 prior to 11:45 for it to be resulted in time.  Left vm for pt to call me for appt info.  Nothing further needed on this message.

## 2021-11-15 ENCOUNTER — Other Ambulatory Visit: Payer: Self-pay | Admitting: *Deleted

## 2021-11-15 ENCOUNTER — Other Ambulatory Visit: Payer: Managed Care, Other (non HMO)

## 2021-11-15 DIAGNOSIS — Z01818 Encounter for other preprocedural examination: Secondary | ICD-10-CM

## 2021-11-16 ENCOUNTER — Other Ambulatory Visit: Payer: Self-pay

## 2021-11-16 ENCOUNTER — Encounter (HOSPITAL_COMMUNITY): Payer: Self-pay | Admitting: Pulmonary Disease

## 2021-11-16 NOTE — Anesthesia Preprocedure Evaluation (Signed)
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     Pulmonary exam normal        Cardiovascular negative cardio ROS Normal cardiovascular exam     Neuro/Psych  Headaches, negative psych ROS   GI/Hepatic GERD  Controlled, Liver lesion    Endo/Other  negative endocrine ROS  Renal/GU negative Renal ROS     Musculoskeletal negative musculoskeletal ROS (+)   Abdominal   Peds  Hematology negative hematology ROS (+)   Anesthesia Other Findings   Reproductive/Obstetrics                            Anesthesia Physical Anesthesia Plan  ASA: 2  Anesthesia Plan: General   Post-op Pain Management: Tylenol PO (pre-op)* and Minimal or no pain anticipated   Induction: Intravenous  PONV Risk Score and Plan: 2 and Treatment may vary due to age or medical condition, Ondansetron, Dexamethasone and Midazolam  Airway Management Planned: Oral ETT  Additional Equipment: None  Intra-op Plan:   Post-operative Plan: Extubation in OR  Informed Consent: I have reviewed the patients History and Physical, chart, labs and discussed the procedure including the risks, benefits and alternatives for the proposed anesthesia with the patient or authorized representative who has indicated his/her understanding and acceptance.     Dental advisory given  Plan Discussed with: CRNA and Anesthesiologist  Anesthesia Plan Comments:        Anesthesia Quick Evaluation

## 2021-11-16 NOTE — Progress Notes (Signed)
PCP - Dr [name] Cardiologist - n/a  Chest x-ray - 08/13/21 EKG - n/a Stress Test - n/a ECHO - n/a Cardiac Cath - n/a  ICD Pacemaker/Loop - n/a  Sleep Study -  n/a CPAP - none  STOP now taking any Aspirin (unless otherwise instructed by your surgeon), Aleve, Naproxen, Ibuprofen, Motrin, Advil, Goody's, BC's, all herbal medications, fish oil, and all vitamins.   Coronavirus Screening Do you have any of the following symptoms:  Cough - No Fever (>100.7F)  yes/no: No Runny nose yes/no: No Sore throat yes/no: No Difficulty breathing/shortness of breath Yes  Have you traveled in the last 14 days and where? yes/no: No  Patient verbalized understanding of instructions that were given via phone.

## 2021-11-17 ENCOUNTER — Ambulatory Visit (HOSPITAL_COMMUNITY): Payer: Managed Care, Other (non HMO) | Admitting: Anesthesiology

## 2021-11-17 ENCOUNTER — Encounter (HOSPITAL_COMMUNITY): Payer: Self-pay | Admitting: Pulmonary Disease

## 2021-11-17 ENCOUNTER — Ambulatory Visit (HOSPITAL_COMMUNITY): Payer: Managed Care, Other (non HMO)

## 2021-11-17 ENCOUNTER — Ambulatory Visit (HOSPITAL_BASED_OUTPATIENT_CLINIC_OR_DEPARTMENT_OTHER): Payer: Managed Care, Other (non HMO) | Admitting: Anesthesiology

## 2021-11-17 ENCOUNTER — Ambulatory Visit: Payer: Managed Care, Other (non HMO) | Admitting: Internal Medicine

## 2021-11-17 ENCOUNTER — Encounter (HOSPITAL_COMMUNITY)
Admission: RE | Disposition: A | Discharge status: Home / Self Care | Payer: Self-pay | Source: Home / Self Care | Attending: Pulmonary Disease

## 2021-11-17 ENCOUNTER — Ambulatory Visit (HOSPITAL_COMMUNITY)
Admission: RE | Admit: 2021-11-17 | Discharge: 2021-11-17 | Disposition: A | Discharge status: Home / Self Care | Payer: Managed Care, Other (non HMO) | Attending: Pulmonary Disease | Admitting: Pulmonary Disease

## 2021-11-17 DIAGNOSIS — R590 Localized enlarged lymph nodes: Secondary | ICD-10-CM | POA: Insufficient documentation

## 2021-11-17 DIAGNOSIS — F17210 Nicotine dependence, cigarettes, uncomplicated: Secondary | ICD-10-CM | POA: Insufficient documentation

## 2021-11-17 DIAGNOSIS — R918 Other nonspecific abnormal finding of lung field: Secondary | ICD-10-CM

## 2021-11-17 DIAGNOSIS — K219 Gastro-esophageal reflux disease without esophagitis: Secondary | ICD-10-CM | POA: Insufficient documentation

## 2021-11-17 DIAGNOSIS — Z01818 Encounter for other preprocedural examination: Secondary | ICD-10-CM

## 2021-11-17 HISTORY — DX: Dyspnea, unspecified: R06.00

## 2021-11-17 HISTORY — PX: VIDEO BRONCHOSCOPY WITH ENDOBRONCHIAL ULTRASOUND: SHX6177

## 2021-11-17 HISTORY — DX: Otalgia, unspecified ear: H92.09

## 2021-11-17 HISTORY — PX: BRONCHIAL WASHINGS: SHX5105

## 2021-11-17 HISTORY — PX: BRONCHIAL NEEDLE ASPIRATION BIOPSY: SHX5106

## 2021-11-17 LAB — CBC
HCT: 51.8 % (ref 39.0–52.0)
Hemoglobin: 17.7 g/dL — ABNORMAL HIGH (ref 13.0–17.0)
MCH: 29.5 pg (ref 26.0–34.0)
MCHC: 34.2 g/dL (ref 30.0–36.0)
MCV: 86.3 fL (ref 80.0–100.0)
Platelets: 259 10*3/uL (ref 150–400)
RBC: 6 MIL/uL — ABNORMAL HIGH (ref 4.22–5.81)
RDW: 12.9 % (ref 11.5–15.5)
WBC: 7.7 10*3/uL (ref 4.0–10.5)
nRBC: 0 % (ref 0.0–0.2)

## 2021-11-17 LAB — SPECIMEN STATUS REPORT

## 2021-11-17 LAB — NOVEL CORONAVIRUS, NAA: SARS-CoV-2, NAA: NOT DETECTED

## 2021-11-17 SURGERY — BRONCHOSCOPY, WITH EBUS
Anesthesia: General

## 2021-11-17 MED ORDER — LACTATED RINGERS IV SOLN: INTRAVENOUS | Status: DC

## 2021-11-17 MED ORDER — DEXAMETHASONE SODIUM PHOSPHATE 10 MG/ML IJ SOLN
INTRAMUSCULAR | Status: DC | PRN
  Administered 2021-11-17: 4 mg via INTRAVENOUS

## 2021-11-17 MED ORDER — FENTANYL CITRATE (PF) 250 MCG/5ML IJ SOLN
INTRAMUSCULAR | Status: DC | PRN
  Administered 2021-11-17 (×2): 50 ug via INTRAVENOUS

## 2021-11-17 MED ORDER — ACETAMINOPHEN 500 MG PO TABS
ORAL_TABLET | ORAL | Status: AC
  Administered 2021-11-17: 1000 mg via ORAL
  Filled 2021-11-17: qty 2

## 2021-11-17 MED ORDER — LIDOCAINE HCL URETHRAL/MUCOSAL 2 % EX GEL: 1.0000 | Freq: Once | CUTANEOUS | Status: DC

## 2021-11-17 MED ORDER — LIDOCAINE 2% (20 MG/ML) 5 ML SYRINGE
INTRAMUSCULAR | Status: DC | PRN
  Administered 2021-11-17: 50 mg via INTRAVENOUS

## 2021-11-17 MED ORDER — PROMETHAZINE HCL 25 MG/ML IJ SOLN: 6.2500 mg | INTRAMUSCULAR | Status: DC | PRN

## 2021-11-17 MED ORDER — ROCURONIUM BROMIDE 10 MG/ML (PF) SYRINGE
PREFILLED_SYRINGE | INTRAVENOUS | Status: DC | PRN
  Administered 2021-11-17: 40 mg via INTRAVENOUS

## 2021-11-17 MED ORDER — CHLORHEXIDINE GLUCONATE 0.12 % MT SOLN
OROMUCOSAL | Status: AC
  Administered 2021-11-17: 15 mL
  Filled 2021-11-17: qty 15

## 2021-11-17 MED ORDER — FENTANYL CITRATE (PF) 100 MCG/2ML IJ SOLN
INTRAMUSCULAR | Status: AC
  Filled 2021-11-17: qty 2

## 2021-11-17 MED ORDER — ACETAMINOPHEN 500 MG PO TABS: 1000.0000 mg | ORAL_TABLET | Freq: Once | ORAL | Status: AC

## 2021-11-17 MED ORDER — MIDAZOLAM HCL 2 MG/2ML IJ SOLN
INTRAMUSCULAR | Status: AC
  Filled 2021-11-17: qty 2

## 2021-11-17 MED ORDER — PHENYLEPHRINE HCL-NACL 20-0.9 MG/250ML-% IV SOLN
INTRAVENOUS | Status: DC | PRN
  Administered 2021-11-17: 25 ug/min via INTRAVENOUS

## 2021-11-17 MED ORDER — OXYCODONE HCL 5 MG PO TABS
5.0000 mg | ORAL_TABLET | Freq: Once | ORAL | Status: DC | PRN
  Filled 2021-11-17: qty 1

## 2021-11-17 MED ORDER — MIDAZOLAM HCL 5 MG/5ML IJ SOLN
INTRAMUSCULAR | Status: DC | PRN
  Administered 2021-11-17: 2 mg via INTRAVENOUS

## 2021-11-17 MED ORDER — SUGAMMADEX SODIUM 200 MG/2ML IV SOLN
INTRAVENOUS | Status: DC | PRN
  Administered 2021-11-17: 200 mg via INTRAVENOUS

## 2021-11-17 MED ORDER — FENTANYL CITRATE (PF) 100 MCG/2ML IJ SOLN: 25.0000 ug | INTRAMUSCULAR | Status: DC | PRN

## 2021-11-17 MED ORDER — PROPOFOL 10 MG/ML IV BOLUS
INTRAVENOUS | Status: DC | PRN
  Administered 2021-11-17: 150 mg via INTRAVENOUS

## 2021-11-17 MED ORDER — PHENYLEPHRINE 80 MCG/ML (10ML) SYRINGE FOR IV PUSH (FOR BLOOD PRESSURE SUPPORT)
PREFILLED_SYRINGE | INTRAVENOUS | Status: DC | PRN
  Administered 2021-11-17: 80 ug via INTRAVENOUS

## 2021-11-17 MED ORDER — OXYCODONE HCL 5 MG/5ML PO SOLN
5.0000 mg | Freq: Once | ORAL | Status: DC | PRN
  Filled 2021-11-17: qty 5

## 2021-11-17 MED ORDER — ONDANSETRON HCL 4 MG/2ML IJ SOLN
INTRAMUSCULAR | Status: DC | PRN
  Administered 2021-11-17: 4 mg via INTRAVENOUS

## 2021-11-17 MED ORDER — PHENYLEPHRINE HCL 0.25 % NA SOLN: 1.0000 | Freq: Four times a day (QID) | NASAL | Status: DC | PRN

## 2021-11-17 NOTE — Op Note (Signed)
Flexible and EBUS Bronchoscopy Procedure Note  Tyler [name]  [id number]  1985-03-13  Date:11/17/21  Time:10:04 AM   Provider Performing:Ronaldo Crilly B Shyhiem Beeney   Procedure: Flexible bronchoscopy and EBUS Bronchoscopy  Indication(s) Mediastinal Adenopathy  Consent Risks of the procedure as well as the alternatives and risks of each were explained to the patient and/or caregiver.  Consent for the procedure was obtained.  Anesthesia General Anesthesia   Time Out Verified patient identification, verified procedure, site/side was marked, verified correct patient position, special equipment/implants available, medications/allergies/relevant history reviewed, required imaging and test results available.   Sterile Technique Usual hand hygiene, masks, gowns, and gloves were used   Procedure Description Diagnostic bronchoscope advanced through endotracheal tube and into airway.  Airways were examined down to subsegmental level with findings noted below.  Following diagnostic evaluation, the diagnostic bronchoscope was then removed and the EBUS bronchoscope was advanced into airway with station 7 biopsied and sent for slide, cell block, and/or culture.  The EBUS bronchoscope was removed after assuring no active bleeding from biopsy site.  Findings:  - normal appearing airways - Enlarged station 7 LN   Complications/Tolerance None; patient tolerated the procedure well. Chest X-ray is needed post procedure.   EBL Minimal   Specimen(s) Station 7 LN - slide/cell block for cytology RUL BAL - cultures, cytology

## 2021-11-17 NOTE — Discharge Instructions (Signed)
We will call you with the results either by end of the week or early next week.  We will discuss treatment options at that time along with scheduling a follow up visit.   If coughing up blood worsens please call us at the office or come to the ER for evaluation

## 2021-11-17 NOTE — Anesthesia Postprocedure Evaluation (Signed)
Anesthesia Post Note  Patient: Tyler [name]  Procedure(s) Performed: VIDEO BRONCHOSCOPY WITH ENDOBRONCHIAL ULTRASOUND BRONCHIAL NEEDLE ASPIRATION BIOPSIES BRONCHIAL WASHINGS     Patient location during evaluation: PACU Anesthesia Type: General Level of consciousness: awake and alert Pain management: pain level controlled Vital Signs Assessment: post-procedure vital signs reviewed and stable Respiratory status: spontaneous breathing, nonlabored ventilation and respiratory function stable Cardiovascular status: stable and blood pressure returned to baseline Anesthetic complications: no   No notable events documented.  Last Vitals:  Vitals:   11/17/21 1030 11/17/21 1045  BP: 113/76 109/74  Pulse: 93 91  Resp: 20 (!) 26  Temp:  36.9 C  SpO2: 93% 94%    Last Pain:  Vitals:   11/17/21 1045  TempSrc:   PainSc: 0-No pain   Pain Goal:                   Audry Pili

## 2021-11-17 NOTE — Progress Notes (Signed)
Dr Erin Fulling looked at xray and was good with discharge.

## 2021-11-17 NOTE — Transfer of Care (Signed)
Immediate Anesthesia Transfer of Care Note  Patient: Tyler [name]  Procedure(s) Performed: VIDEO BRONCHOSCOPY WITH ENDOBRONCHIAL ULTRASOUND BRONCHIAL NEEDLE ASPIRATION BIOPSIES BRONCHIAL WASHINGS  Patient Location: PACU  Anesthesia Type:General  Level of Consciousness: awake, oriented and patient cooperative  Airway & Oxygen Therapy: Patient Spontanous Breathing  Post-op Assessment: Report given to RN and Post -op Vital signs reviewed and stable  Post vital signs: Reviewed and stable  Last Vitals:  Vitals Value Taken Time  BP 112/81 11/17/21 1008  Temp 36.9 C 11/17/21 1008  Pulse 102 11/17/21 1011  Resp 16 11/17/21 1011  SpO2 95 % 11/17/21 1011  Vitals shown include unvalidated device data.  Last Pain:  Vitals:   11/17/21 0657  TempSrc: Oral  PainSc:          Complications: No notable events documented.

## 2021-11-17 NOTE — Anesthesia Procedure Notes (Signed)
Procedure Name: Intubation Date/Time: 11/17/2021 8:53 AM  Performed by: Lowella Dell, CRNAPre-anesthesia Checklist: Patient identified, Emergency Drugs available, Suction available and Patient being monitored Patient Re-evaluated:Patient Re-evaluated prior to induction Oxygen Delivery Method: Circle System Utilized Preoxygenation: Pre-oxygenation with 100% oxygen Induction Type: IV induction Ventilation: Mask ventilation without difficulty Laryngoscope Size: Mac and 4 Grade View: Grade I Tube type: Oral Tube size: 8.5 mm Number of attempts: 1 Airway Equipment and Method: Stylet Placement Confirmation: ETT inserted through vocal cords under direct vision, positive ETCO2 and breath sounds checked- equal and bilateral Secured at: 22 cm Tube secured with: Tape Dental Injury: Teeth and Oropharynx as per pre-operative assessment

## 2021-11-17 NOTE — Interval H&P Note (Signed)
History and Physical Interval Note:  11/17/2021 7:24 AM  Tyler [name]  has presented today for surgery, with the diagnosis of lung mass.  The various methods of treatment have been discussed with the patient and family. After consideration of risks, benefits and other options for treatment, the patient has consented to  Procedure(s): [place] (N/A) as a surgical intervention.  The patient's history has been reviewed, patient examined, no change in status, stable for surgery.  I have reviewed the patient's chart and labs.  Questions were answered to the patient's satisfaction.     [name]

## 2021-11-18 ENCOUNTER — Encounter: Payer: Self-pay | Admitting: Pulmonary Disease

## 2021-11-19 ENCOUNTER — Emergency Department (HOSPITAL_COMMUNITY): Payer: Managed Care, Other (non HMO)

## 2021-11-19 ENCOUNTER — Emergency Department (HOSPITAL_COMMUNITY)
Admission: EM | Admit: 2021-11-19 | Discharge: 2021-11-19 | Disposition: A | Discharge status: Home / Self Care | Payer: Managed Care, Other (non HMO) | Attending: Emergency Medicine | Admitting: Emergency Medicine

## 2021-11-19 ENCOUNTER — Encounter (HOSPITAL_COMMUNITY): Payer: Self-pay

## 2021-11-19 ENCOUNTER — Other Ambulatory Visit: Payer: Self-pay

## 2021-11-19 DIAGNOSIS — J982 Interstitial emphysema: Secondary | ICD-10-CM | POA: Insufficient documentation

## 2021-11-19 DIAGNOSIS — R1310 Dysphagia, unspecified: Secondary | ICD-10-CM | POA: Insufficient documentation

## 2021-11-19 DIAGNOSIS — R221 Localized swelling, mass and lump, neck: Secondary | ICD-10-CM | POA: Insufficient documentation

## 2021-11-19 DIAGNOSIS — J029 Acute pharyngitis, unspecified: Secondary | ICD-10-CM | POA: Diagnosis not present

## 2021-11-19 LAB — BASIC METABOLIC PANEL
Anion gap: 8 (ref 5–15)
BUN: 11 mg/dL (ref 6–20)
CO2: 24 mmol/L (ref 22–32)
Calcium: 9.1 mg/dL (ref 8.9–10.3)
Chloride: 108 mmol/L (ref 98–111)
Creatinine, Ser: 0.93 mg/dL (ref 0.61–1.24)
GFR, Estimated: >60 mL/min (ref >60)
Glucose, Bld: 194 mg/dL — ABNORMAL HIGH (ref 70–99)
Potassium: 3.4 mmol/L — ABNORMAL LOW (ref 3.5–5.1)
Sodium: 140 mmol/L (ref 135–145)

## 2021-11-19 LAB — CULTURE, BAL-QUANTITATIVE W GRAM STAIN
Culture: NO GROWTH
Gram Stain: NONE SEEN

## 2021-11-19 LAB — CBC WITH DIFFERENTIAL/PLATELET
Abs Immature Granulocytes: 0.02 10*3/uL (ref 0.00–0.07)
Basophils Absolute: 0 10*3/uL (ref 0.0–0.1)
Basophils Relative: 0 %
Eosinophils Absolute: 0.1 10*3/uL (ref 0.0–0.5)
Eosinophils Relative: 2 %
HCT: 47.5 % (ref 39.0–52.0)
Hemoglobin: 16.2 g/dL (ref 13.0–17.0)
Immature Granulocytes: 0 %
Lymphocytes Relative: 14 %
Lymphs Abs: 0.9 10*3/uL (ref 0.7–4.0)
MCH: 29.8 pg (ref 26.0–34.0)
MCHC: 34.1 g/dL (ref 30.0–36.0)
MCV: 87.3 fL (ref 80.0–100.0)
Monocytes Absolute: 0.3 10*3/uL (ref 0.1–1.0)
Monocytes Relative: 5 %
Neutro Abs: 4.7 10*3/uL (ref 1.7–7.7)
Neutrophils Relative %: 79 %
Platelets: 237 10*3/uL (ref 150–400)
RBC: 5.44 MIL/uL (ref 4.22–5.81)
RDW: 12.8 % (ref 11.5–15.5)
WBC: 6 10*3/uL (ref 4.0–10.5)
nRBC: 0 % (ref 0.0–0.2)

## 2021-11-19 LAB — CULTURE, RESPIRATORY W GRAM STAIN
Culture: NO GROWTH
Gram Stain: NONE SEEN

## 2021-11-19 LAB — CYTOLOGY - NON PAP

## 2021-11-19 MED ORDER — MORPHINE SULFATE (PF) 4 MG/ML IV SOLN
4.0000 mg | Freq: Once | INTRAVENOUS | Status: AC
  Administered 2021-11-19: 4 mg via INTRAVENOUS
  Filled 2021-11-19: qty 1

## 2021-11-19 MED ORDER — SODIUM CHLORIDE (PF) 0.9 % IJ SOLN
INTRAMUSCULAR | Status: AC
  Filled 2021-11-19: qty 50

## 2021-11-19 MED ORDER — SODIUM CHLORIDE 0.9 % IV BOLUS
1000.0000 mL | Freq: Once | INTRAVENOUS | Status: AC
  Administered 2021-11-19: 1000 mL via INTRAVENOUS

## 2021-11-19 MED ORDER — IOHEXOL 300 MG/ML  SOLN
75.0000 mL | Freq: Once | INTRAMUSCULAR | Status: AC | PRN
  Administered 2021-11-19: 75 mL via INTRAVENOUS

## 2021-11-19 MED ORDER — MORPHINE SULFATE 15 MG PO TABS: 7.5000 mg | ORAL_TABLET | ORAL | 0 refills | Status: DC | PRN

## 2021-11-19 MED ORDER — DIPHENHYDRAMINE HCL 50 MG/ML IJ SOLN
25.0000 mg | Freq: Once | INTRAMUSCULAR | Status: AC
  Administered 2021-11-19: 25 mg via INTRAVENOUS
  Filled 2021-11-19: qty 1

## 2021-11-19 MED ORDER — ONDANSETRON HCL 4 MG/2ML IJ SOLN
4.0000 mg | Freq: Once | INTRAMUSCULAR | Status: AC
  Administered 2021-11-19: 4 mg via INTRAVENOUS
  Filled 2021-11-19: qty 2

## 2021-11-19 NOTE — Telephone Encounter (Signed)
Pt returned call. Informed him of the recs per Dr. Silas Flood. Pt verbalized understanding and denied any further questions or concerns at this time.

## 2021-11-19 NOTE — Telephone Encounter (Signed)
Please contact the patient ASAP and instruct him to go to the emergency room.  Based on description of his neck pain and the crunchy feeling when he palpates his neck, worried about pneumothorax and subcutaneous emphysema.  He needs stat chest imaging and evaluation in the emergency room.

## 2021-11-19 NOTE — Telephone Encounter (Signed)
Dr. Silas Flood, please advise on pt's message regarding his symptoms post bronchoscopy. Dr. Erin Fulling is unavailable. Thanks.

## 2021-11-19 NOTE — ED Provider Notes (Signed)
[facility] DEPT Provider Note   CSN: [id number] Arrival date & time: 11/19/21  1309     History  Chief Complaint  Patient presents with   neck swelling   Sore Throat   Dysphagia    Tyler [name] is a 36 y.o. male.  36 yo M with a chief complaints of sore throat after having a bronchoscopy about 48 hours ago.  He was told that he had it done for possible sarcoidosis.  Had a lymph node biopsy at that time.  Since then he has developed some painful swallowing and felt like when he pushed on the side of his neck that he felt a crunching of air.   Sore Throat       Home Medications Prior to Admission medications   Medication Sig Start Date End Date Taking? Authorizing Provider  morphine (MSIR) 15 MG tablet Take 0.5 tablets (7.5 mg total) by mouth every 4 (four) hours as needed for severe pain. 11/19/21  Yes Deno Etienne, DO  ibuprofen (ADVIL) 200 MG tablet Take 400 mg by mouth every 8 (eight) hours as needed (pain.).    [provider]  Vitamin D, Ergocalciferol, (DRISDOL) 1.25 MG (50000 UNIT) CAPS capsule Take 1 capsule (50,000 Units total) by mouth every 7 (seven) days. Patient not taking: Reported on 11/11/2021 08/09/21   Ann Held, DO      Allergies    Bactrim [sulfamethoxazole-trimethoprim] and Lactose intolerance (gi)    Review of Systems   Review of Systems  Physical Exam Updated Vital Signs BP 91/66   Pulse (!) 54   Temp 97.8 F (36.6 C) (Oral)   Resp 18   Ht '5\' 4"'$  (1.626 m)   Wt 54.4 kg   SpO2 95%   BMI 20.60 kg/m  Physical Exam Vitals and nursing note reviewed.  Constitutional:      Appearance: He is well-developed.  HENT:     Head: Normocephalic and atraumatic.     Comments: Bilateral neck swelling.  No appreciable crepitus.  Mild posterior oropharyngeal erythema.  Tolerating secretions without difficulty. Eyes:     Pupils: Pupils are equal, round, and reactive to light.  Neck:     Vascular: No  JVD.  Cardiovascular:     Rate and Rhythm: Normal rate and regular rhythm.     Heart sounds: No murmur heard.    No friction rub. No gallop.  Pulmonary:     Effort: No respiratory distress.     Breath sounds: No wheezing.  Abdominal:     General: There is no distension.     Tenderness: There is no abdominal tenderness. There is no guarding or rebound.  Musculoskeletal:        General: Normal range of motion.     Cervical back: Normal range of motion and neck supple.  Skin:    Coloration: Skin is not pale.     Findings: No rash.  Neurological:     Mental Status: He is alert and oriented to person, place, and time.  Psychiatric:        Behavior: Behavior normal.     ED Results / Procedures / Treatments   Labs (all labs ordered are listed, but only abnormal results are displayed) Labs Reviewed  BASIC METABOLIC PANEL - Abnormal; Notable for the following components:      Result Value   Potassium 3.4 (*)    Glucose, Bld 194 (*)    All other components within normal limits  CBC WITH DIFFERENTIAL/PLATELET  I-STAT CHEM 8, ED    EKG None  Radiology CT Chest W Contrast  Result Date: 11/19/2021 CLINICAL DATA:  Status post bronchoscopy 2 days ago with subsequent neck swelling and difficulty swallowing. EXAM: CT CHEST WITH CONTRAST TECHNIQUE: Multidetector CT imaging of the chest was performed during intravenous contrast administration. RADIATION DOSE REDUCTION: This exam was performed according to the departmental dose-optimization program which includes automated exposure control, adjustment of the mA and/or kV according to patient size and/or use of iterative reconstruction technique. CONTRAST:  36m OMNIPAQUE IOHEXOL 300 MG/ML  SOLN COMPARISON:  Jul 04, 2015 FINDINGS: Cardiovascular: No significant vascular findings. Normal heart size. No pericardial effusion. Mediastinum/Nodes: A large amount of air is seen throughout the mediastinum. This extends superiorly to involve the bilateral  neck soft tissues. No enlarged mediastinal, hilar, or axillary lymph nodes. Thyroid gland, trachea, and esophagus demonstrate no significant findings. Lungs/Pleura: Mild patchy posterior right lower lobe infiltrate is seen, with mild diffuse grainy and micro nodular appearance of the remainder of both lungs. There is no evidence of a pleural effusion or pneumothorax. Upper Abdomen: No acute abnormality. Musculoskeletal: No chest wall abnormality. No acute or significant osseous findings. IMPRESSION: 1. Marked severity pneumomediastinum with marked severity subcutaneous emphysema involving the bilateral neck soft tissues. 2. Mild posterior right lower lobe infiltrate, with mild diffuse grainy and micro nodular appearance of the remainder of both lungs likely secondary to a diffuse infectious or inflammatory process. Electronically Signed   By: TVirgina NorfolkM.D.   On: 11/19/2021 17:17   CT Soft Tissue Neck W Contrast  Result Date: 11/19/2021 CLINICAL DATA:  Concern for esophageal injury after bronchoscopy 2 days ago. EXAM: CT NECK WITH CONTRAST TECHNIQUE: Multidetector CT imaging of the neck was performed using the standard protocol following the bolus administration of intravenous contrast. RADIATION DOSE REDUCTION: This exam was performed according to the departmental dose-optimization program which includes automated exposure control, adjustment of the mA and/or kV according to patient size and/or use of iterative reconstruction technique. CONTRAST:  741mOMNIPAQUE IOHEXOL 300 MG/ML  SOLN COMPARISON:  None Available. FINDINGS: Pharynx and larynx: Normal. No mass or swelling. Extensive pneumomediastinum, as described below. Salivary glands: No inflammation, mass, or stone. Thyroid: Normal. Lymph nodes: Prominent 8 mm left level 2A lymph node. Vascular: Negative. Limited intracranial: Negative. Visualized orbits: Negative. Mastoids and visualized paranasal sinuses: Clear. Skeleton: No acute or aggressive  process. Upper chest: Please see separately dictated CT chest for additional findings Other: There is extensive pneumomediastinum and subcutaneous emphysema extending from the level of the carina superiorly to the level of the oropharynx. Subcutaneous emphysema tracts bilaterally in the cervical neck. There is no evidence of inflammatory fat stranding in the neck to definitively suggest a source for pneumomediastinum. IMPRESSION: There is extensive pneumomediastinum and subcutaneous emphysema extending from the level of the carina superiorly to the level of the oropharynx. There is no evidence of inflammatory fat stranding in the neck to definitively suggest a source for the subcutaneous emphysema. Please see separately dictated CT chest for additional findings. Depending on the level of clinical concern for esophageal injury after a presumed transbronchial biopsy, an esophagram could be performed for more definitive characterization. Electronically Signed   By: HeMarin Roberts.D.   On: 11/19/2021 17:12   DG Chest Port 1 View  Result Date: 11/19/2021 CLINICAL DATA:  Post bronch, neck difficulty with swelling. EXAM: PORTABLE CHEST 1 VIEW COMPARISON:  November 17, 2021 FINDINGS: Trachea midline. Cardiomediastinal contours and  hilar structures are normal. Reticulonodular opacities and interstitial prominence in the chest without consolidative process. No sign of pneumothorax. No pleural effusion on frontal radiograph. EKG leads project over the chest. Subcutaneous emphysema is present in the neck with pneumomediastinum greater on the RIGHT than the LEFT. On limited assessment there is no acute skeletal process. IMPRESSION: 1. Subcutaneous emphysema in the neck with pneumomediastinum greater on the RIGHT than the LEFT. Bronchial source is considered given distribution and history. 2. Reticulonodular opacities and interstitial prominence in the chest without consolidative process. Resolution of more confluent  opacity in the RIGHT chest since the prior study still with this reticulonodular in pattern with interstitial prominence. Again high-resolution chest CT may be helpful as warranted for further assessment. These results were called by telephone at the time of interpretation on 11/19/2021 at 3:46 pm to provider Sacoya Mcgourty , who verbally acknowledged these results. w Electronically Signed   By: Zetta Bills M.D.   On: 11/19/2021 15:47    Procedures Procedures    Medications Ordered in ED Medications  sodium chloride (PF) 0.9 % injection (has no administration in time range)  morphine (PF) 4 MG/ML injection 4 mg (4 mg Intravenous Given 11/19/21 1601)  ondansetron (ZOFRAN) injection 4 mg (4 mg Intravenous Given 11/19/21 1558)  sodium chloride 0.9 % bolus 1,000 mL (1,000 mLs Intravenous New Bag/Given 11/19/21 1604)  diphenhydrAMINE (BENADRYL) injection 25 mg (25 mg Intravenous Given 11/19/21 1603)  iohexol (OMNIPAQUE) 300 MG/ML solution 75 mL (75 mLs Intravenous Contrast Given 11/19/21 1636)    ED Course/ Medical Decision Making/ A&P                           Medical Decision Making Amount and/or Complexity of Data Reviewed Labs: ordered. Radiology: ordered.  Risk Prescription drug management.   36 yo M with a chief complaint of painful swallowing and feeling of crunching lupus on the right side of his neck after having a bronchoscopy about 48 hours ago.  Concern for perforation.  Plain film of the chest with subcutaneous air on my read.  I discussed with the radiologist who agrees and also sees mediastinal air.  We will obtain a CT soft tissue neck with chest.  CT scan with free mediastinal air.  Tracks up into the subcutaneous tissue of the neck.  I discussed this with Dr. Cyndia Bent, [facility].  He felt okay for outpatient follow-up with his pulmonologist.  Recommended returning for any worsening.  6:14 PM:  I have discussed the diagnosis/risks/treatment options with the patient.  Evaluation and  diagnostic testing in the emergency department does not suggest an emergent condition requiring admission or immediate intervention beyond what has been performed at this time.  They will follow up with PCP, pulm. We also discussed returning to the ED immediately if new or worsening sx occur. We discussed the sx which are most concerning (e.g., sudden worsening pain, sob, fever, inability to tolerate by mouth) that necessitate immediate return. Medications administered to the patient during their visit and any new prescriptions provided to the patient are listed below.  Medications given during this visit Medications  sodium chloride (PF) 0.9 % injection (has no administration in time range)  morphine (PF) 4 MG/ML injection 4 mg (4 mg Intravenous Given 11/19/21 1601)  ondansetron (ZOFRAN) injection 4 mg (4 mg Intravenous Given 11/19/21 1558)  sodium chloride 0.9 % bolus 1,000 mL (1,000 mLs Intravenous New Bag/Given 11/19/21 1604)  diphenhydrAMINE (BENADRYL) injection  25 mg (25 mg Intravenous Given 11/19/21 1603)  iohexol (OMNIPAQUE) 300 MG/ML solution 75 mL (75 mLs Intravenous Contrast Given 11/19/21 1636)     The patient appears reasonably screen and/or stabilized for discharge and I doubt any other medical condition or other [facility] requiring further screening, evaluation, or treatment in the ED at this time prior to discharge.          Final Clinical Impression(s) / ED Diagnoses Final diagnoses:  Mediastinal air ([place])    Rx / DC Orders ED Discharge Orders          Ordered    morphine (MSIR) 15 MG tablet  Every 4 hours PRN        11/19/21 Worden, [place], DO 11/19/21 1814

## 2021-11-19 NOTE — Telephone Encounter (Signed)
Attempted to contact pt x 3. Call kept going to VM. Left detailed message per Dr. Silas Flood and sent MyChart message with instructions.

## 2021-11-19 NOTE — ED Triage Notes (Signed)
Patient states he had a bronchoscopy 2 days ago and now has neck swelling, difficulty swallowing and states, "I feel air bubbles on several places of my throat."

## 2021-11-19 NOTE — Discharge Instructions (Addendum)
Please return for any worsening.  Take 4 over the counter ibuprofen tablets 3 times a day or 2 over-the-counter naproxen tablets twice a day for pain. Also take tylenol '1000mg'$ (2 extra strength) four times a day.   Then take the pain medicine if you feel like you need it. Narcotics do not help with the pain, they only make you care about it less.  You can become addicted to this, people may break into your house to steal it.  It will constipate you.  If you drive under the influence of this medicine you can get a DUI.

## 2021-11-20 LAB — ACID FAST SMEAR (AFB, MYCOBACTERIA): Acid Fast Smear: NEGATIVE

## 2021-11-22 LAB — AEROBIC/ANAEROBIC CULTURE W GRAM STAIN (SURGICAL/DEEP WOUND)
Culture: NO GROWTH
Gram Stain: NONE SEEN

## 2021-11-24 ENCOUNTER — Telehealth: Payer: Self-pay | Admitting: Pulmonary Disease

## 2021-11-24 DIAGNOSIS — R918 Other nonspecific abnormal finding of lung field: Secondary | ICD-10-CM

## 2021-11-24 NOTE — Telephone Encounter (Signed)
Spoke with patient this morning. He reports his chest discomfort and air under the skin is improving.   I let him know the LN biopsy results are negative for cancer or inflammation.   I have recommended he avoid air travel in the near future until complete resolution of the pneumomediastinum. We will check an x-ray next Tuesday or Wednesday. Order has been placed.  I also recommended inflammatory lab workup for his symptoms and CT chest findings to rule out interstitial lung disease. Orders have been placed. He can have labs done when he comes in for chest x-ray.  Freda Jackson, MD [facility] Office: [phone]   See Amion for personal pager PCCM on call pager [phone] until 7pm. Please call Elink 7p-7a. [phone]

## 2021-11-26 LAB — MYCOBACTERIA,CULT W/FLUOROCHROME SMEAR
MICRO NUMBER:: 13785515
SMEAR:: NONE SEEN
SPECIMEN QUALITY:: ADEQUATE

## 2021-12-20 LAB — FUNGUS CULTURE WITH STAIN

## 2021-12-20 LAB — FUNGUS CULTURE RESULT

## 2021-12-20 LAB — FUNGAL ORGANISM REFLEX

## 2021-12-23 ENCOUNTER — Telehealth: Payer: Self-pay | Admitting: Pulmonary Disease

## 2021-12-23 NOTE — Telephone Encounter (Signed)
Patient called back- informed him we are needing CT Chest disc from [facility]- patient will call [facility] and get disc.

## 2021-12-23 NOTE — Telephone Encounter (Signed)
Patient states that [facility] needed something from the doctor to get a disc- spoke with [facility] and they are going to push images to PACS. Please advise if images are received.

## 2021-12-23 NOTE — Telephone Encounter (Signed)
Per LOV pt was to bring [place] ct chest disc from [facility] and once reviewed would discuss scheduling bronch  I called the pt and there was no answer- LMTCB.

## 2021-12-24 NOTE — Telephone Encounter (Signed)
I was able to find the images from [facility] in [facility].

## 2021-12-29 NOTE — Telephone Encounter (Signed)
I will work on scheduling patient for repeat bronchoscopy with transbronchial biopsies.  Thanks, JD

## 2022-01-01 LAB — ACID FAST CULTURE WITH REFLEXED SENSITIVITIES (MYCOBACTERIA): Acid Fast Culture: NEGATIVE

## 2022-01-10 ENCOUNTER — Encounter: Payer: Self-pay | Admitting: Pulmonary Disease

## 2022-01-13 ENCOUNTER — Telehealth: Payer: Self-pay | Admitting: Pulmonary Disease

## 2022-01-13 DIAGNOSIS — R590 Localized enlarged lymph nodes: Secondary | ICD-10-CM

## 2022-01-13 DIAGNOSIS — R918 Other nonspecific abnormal finding of lung field: Secondary | ICD-10-CM

## 2022-01-13 NOTE — Telephone Encounter (Signed)
Cherina, please advise. Thanks.

## 2022-01-13 NOTE — Telephone Encounter (Signed)
Please let patient know I have placed orders to schedule the bronchoscopy for early next week (Mon, Tues or Wed) if availability. We will be in touch once we hear from the endoscopy suite.  Thanks, JD

## 2022-01-13 NOTE — Telephone Encounter (Signed)
Sent MyChart message to patient to let him know about the possible dates.

## 2022-01-14 ENCOUNTER — Encounter: Payer: Self-pay | Admitting: Pulmonary Disease

## 2022-01-17 ENCOUNTER — Other Ambulatory Visit: Payer: Managed Care, Other (non HMO)

## 2022-01-17 ENCOUNTER — Other Ambulatory Visit: Payer: Self-pay | Admitting: *Deleted

## 2022-01-17 DIAGNOSIS — Z01818 Encounter for other preprocedural examination: Secondary | ICD-10-CM

## 2022-01-18 ENCOUNTER — Encounter (HOSPITAL_COMMUNITY): Payer: Self-pay | Admitting: Pulmonary Disease

## 2022-01-18 ENCOUNTER — Other Ambulatory Visit: Payer: Self-pay

## 2022-01-18 NOTE — Progress Notes (Signed)
Spoke with pt for pre-op call. Pt denies cardiac history, HTN or Diabetes.  Shower instructions given to pt and he voiced understanding. 

## 2022-01-19 ENCOUNTER — Ambulatory Visit (HOSPITAL_COMMUNITY): Payer: Managed Care, Other (non HMO)

## 2022-01-19 ENCOUNTER — Ambulatory Visit (HOSPITAL_BASED_OUTPATIENT_CLINIC_OR_DEPARTMENT_OTHER): Payer: Managed Care, Other (non HMO) | Admitting: Anesthesiology

## 2022-01-19 ENCOUNTER — Encounter (HOSPITAL_COMMUNITY): Payer: Self-pay | Admitting: Pulmonary Disease

## 2022-01-19 ENCOUNTER — Other Ambulatory Visit: Payer: Self-pay

## 2022-01-19 ENCOUNTER — Ambulatory Visit (HOSPITAL_COMMUNITY)
Admission: RE | Admit: 2022-01-19 | Discharge: 2022-01-19 | Disposition: A | Discharge status: Home / Self Care | Payer: Managed Care, Other (non HMO) | Attending: Pulmonary Disease | Admitting: Pulmonary Disease

## 2022-01-19 ENCOUNTER — Encounter (HOSPITAL_COMMUNITY)
Admission: RE | Disposition: A | Discharge status: Home / Self Care | Payer: Self-pay | Source: Home / Self Care | Attending: Pulmonary Disease

## 2022-01-19 ENCOUNTER — Ambulatory Visit (HOSPITAL_COMMUNITY): Payer: Managed Care, Other (non HMO) | Admitting: Anesthesiology

## 2022-01-19 DIAGNOSIS — F32A Depression, unspecified: Secondary | ICD-10-CM | POA: Insufficient documentation

## 2022-01-19 DIAGNOSIS — R918 Other nonspecific abnormal finding of lung field: Secondary | ICD-10-CM

## 2022-01-19 DIAGNOSIS — F419 Anxiety disorder, unspecified: Secondary | ICD-10-CM | POA: Diagnosis not present

## 2022-01-19 DIAGNOSIS — Z01818 Encounter for other preprocedural examination: Secondary | ICD-10-CM

## 2022-01-19 DIAGNOSIS — K219 Gastro-esophageal reflux disease without esophagitis: Secondary | ICD-10-CM | POA: Diagnosis not present

## 2022-01-19 DIAGNOSIS — F17210 Nicotine dependence, cigarettes, uncomplicated: Secondary | ICD-10-CM | POA: Insufficient documentation

## 2022-01-19 HISTORY — DX: Gastro-esophageal reflux disease without esophagitis: K21.9

## 2022-01-19 HISTORY — PX: VIDEO BRONCHOSCOPY: SHX5072

## 2022-01-19 HISTORY — DX: Anxiety disorder, unspecified: F41.9

## 2022-01-19 HISTORY — PX: BRONCHIAL BIOPSY: SHX5109

## 2022-01-19 HISTORY — DX: Depression, unspecified: F32.A

## 2022-01-19 HISTORY — PX: BRONCHIAL WASHINGS: SHX5105

## 2022-01-19 LAB — CBC
HCT: 46.4 % (ref 39.0–52.0)
Hemoglobin: 15.6 g/dL (ref 13.0–17.0)
MCH: 30.1 pg (ref 26.0–34.0)
MCHC: 33.6 g/dL (ref 30.0–36.0)
MCV: 89.6 fL (ref 80.0–100.0)
Platelets: 256 10*3/uL (ref 150–400)
RBC: 5.18 MIL/uL (ref 4.22–5.81)
RDW: 12.4 % (ref 11.5–15.5)
WBC: 5.8 10*3/uL (ref 4.0–10.5)
nRBC: 0 % (ref 0.0–0.2)

## 2022-01-19 LAB — NOVEL CORONAVIRUS, NAA: SARS-CoV-2, NAA: NOT DETECTED

## 2022-01-19 LAB — SPECIMEN STATUS REPORT

## 2022-01-19 SURGERY — BRONCHOSCOPY, WITH FLUOROSCOPY
Anesthesia: General

## 2022-01-19 MED ORDER — ONDANSETRON HCL 4 MG/2ML IJ SOLN
INTRAMUSCULAR | Status: DC | PRN
  Administered 2022-01-19: 4 mg via INTRAVENOUS

## 2022-01-19 MED ORDER — MIDAZOLAM HCL 2 MG/2ML IJ SOLN
INTRAMUSCULAR | Status: DC | PRN
  Administered 2022-01-19: 2 mg via INTRAVENOUS

## 2022-01-19 MED ORDER — PHENYLEPHRINE 80 MCG/ML (10ML) SYRINGE FOR IV PUSH (FOR BLOOD PRESSURE SUPPORT)
PREFILLED_SYRINGE | INTRAVENOUS | Status: DC | PRN
  Administered 2022-01-19 (×2): 240 ug via INTRAVENOUS
  Administered 2022-01-19: 160 ug via INTRAVENOUS

## 2022-01-19 MED ORDER — OXYCODONE HCL 5 MG PO TABS: 5.0000 mg | ORAL_TABLET | Freq: Once | ORAL | Status: DC | PRN

## 2022-01-19 MED ORDER — ROCURONIUM BROMIDE 10 MG/ML (PF) SYRINGE
PREFILLED_SYRINGE | INTRAVENOUS | Status: DC | PRN
  Administered 2022-01-19: 50 mg via INTRAVENOUS

## 2022-01-19 MED ORDER — ONDANSETRON HCL 4 MG/2ML IJ SOLN: 4.0000 mg | Freq: Four times a day (QID) | INTRAMUSCULAR | Status: DC | PRN

## 2022-01-19 MED ORDER — OXYCODONE HCL 5 MG/5ML PO SOLN: 5.0000 mg | Freq: Once | ORAL | Status: DC | PRN

## 2022-01-19 MED ORDER — FENTANYL CITRATE (PF) 100 MCG/2ML IJ SOLN: 25.0000 ug | INTRAMUSCULAR | Status: DC | PRN

## 2022-01-19 MED ORDER — MIDAZOLAM HCL (PF) 5 MG/ML IJ SOLN
INTRAMUSCULAR | Status: AC
  Filled 2022-01-19: qty 1

## 2022-01-19 MED ORDER — FENTANYL CITRATE (PF) 100 MCG/2ML IJ SOLN
INTRAMUSCULAR | Status: DC | PRN
  Administered 2022-01-19 (×2): 50 ug via INTRAVENOUS

## 2022-01-19 MED ORDER — PROPOFOL 10 MG/ML IV BOLUS
INTRAVENOUS | Status: DC | PRN
  Administered 2022-01-19: 200 mg via INTRAVENOUS

## 2022-01-19 MED ORDER — LACTATED RINGERS IV SOLN: INTRAVENOUS | Status: DC

## 2022-01-19 MED ORDER — LIDOCAINE 2% (20 MG/ML) 5 ML SYRINGE
INTRAMUSCULAR | Status: DC | PRN
  Administered 2022-01-19: 60 mg via INTRAVENOUS

## 2022-01-19 MED ORDER — SUGAMMADEX SODIUM 200 MG/2ML IV SOLN
INTRAVENOUS | Status: DC | PRN
  Administered 2022-01-19: 200 mg via INTRAVENOUS

## 2022-01-19 MED ORDER — CHLORHEXIDINE GLUCONATE 0.12 % MT SOLN
15.0000 mL | Freq: Once | OROMUCOSAL | Status: AC
  Administered 2022-01-19: 15 mL via OROMUCOSAL
  Filled 2022-01-19: qty 15

## 2022-01-19 MED ORDER — DEXAMETHASONE SODIUM PHOSPHATE 10 MG/ML IJ SOLN
INTRAMUSCULAR | Status: DC | PRN
  Administered 2022-01-19: 10 mg via INTRAVENOUS

## 2022-01-19 MED ORDER — FENTANYL CITRATE (PF) 100 MCG/2ML IJ SOLN
INTRAMUSCULAR | Status: AC
  Filled 2022-01-19: qty 2

## 2022-01-19 NOTE — Anesthesia Preprocedure Evaluation (Signed)
Anesthesia Evaluation  Patient identified by MRN, date of birth, ID band Patient awake    Reviewed: Allergy & Precautions, H&P , NPO status , Patient's Chart, lab work & pertinent test results  Airway Mallampati: II   Neck ROM: full    Dental   Pulmonary shortness of breath, Current Smoker Pulmonary infiltrates   breath sounds clear to auscultation       Cardiovascular negative cardio ROS  Rhythm:regular Rate:Normal     Neuro/Psych  Headaches PSYCHIATRIC DISORDERS Anxiety Depression       GI/Hepatic ,GERD  ,,  Endo/Other    Renal/GU      Musculoskeletal   Abdominal   Peds  Hematology   Anesthesia Other Findings   Reproductive/Obstetrics                             Anesthesia Physical Anesthesia Plan  ASA: 2  Anesthesia Plan: General   Post-op Pain Management:    Induction: Intravenous  PONV Risk Score and Plan: 1 and Ondansetron, Dexamethasone, Midazolam and Treatment may vary due to age or medical condition  Airway Management Planned: Oral ETT  Additional Equipment:   Intra-op Plan:   Post-operative Plan: Extubation in OR  Informed Consent: I have reviewed the patients History and Physical, chart, labs and discussed the procedure including the risks, benefits and alternatives for the proposed anesthesia with the patient or authorized representative who has indicated his/her understanding and acceptance.     Dental advisory given  Plan Discussed with: CRNA, Anesthesiologist and Surgeon  Anesthesia Plan Comments:        Anesthesia Quick Evaluation

## 2022-01-19 NOTE — Interval H&P Note (Signed)
History and Physical Interval Note:  01/19/2022 1:24 PM  Tyler [name]  has presented today for surgery, with the diagnosis of pulmonary infiltrate.  The various methods of treatment have been discussed with the patient and family. After consideration of risks, benefits and other options for treatment, the patient has consented to  Procedure(s): VIDEO BRONCHOSCOPY WITH FLUORO (N/A) as a surgical intervention.  The patient's history has been reviewed, patient examined, no change in status, stable for surgery.  I have reviewed the patient's chart and labs.  Questions were answered to the patient's satisfaction.     Harris

## 2022-01-19 NOTE — Transfer of Care (Signed)
Immediate Anesthesia Transfer of Care Note  Patient: Tyler [name]  Procedure(s) Performed: VIDEO BRONCHOSCOPY WITH FLUORO BRONCHIAL WASHINGS BRONCHIAL BIOPSIES  Patient Location: Endoscopy Unit  Anesthesia Type:General  Level of Consciousness: oriented and drowsy  Airway & Oxygen Therapy: Patient Spontanous Breathing  Post-op Assessment: Report given to RN  Post vital signs: Reviewed and stable  Last Vitals:  Vitals Value Taken Time  BP 115/66   Temp    Pulse 84   Resp 20   SpO2 94     Last Pain:  Vitals:   01/19/22 1139  TempSrc:   PainSc: 0-No pain         Complications: No notable events documented.

## 2022-01-19 NOTE — H&P (Signed)
Expand All Collapse All    Synopsis: Referred in September 2023 for abnormal chest imaging by Dr. Megan Salon   Subjective:    PATIENT ID: Tyler [name], Tyler [name]   HPI       Chief Complaint  Patient presents with   Consult      Pt is here for consult. Pt was referred by ID to come here. Pt is here for pulm infiltrates. Pt states he is losing a lot of weight in the last 4 months, pt has no appetite, some SOB noted. No inhalers noted from pt.     Tyler [name] is a 36 year old male, daily smoker who is referred to pulmonary clinic for abnormal chest imaging.    He went to [place] in March where he developed high fevers, sore throat and cough. He returned back to the [place] where he was seen in Urgent care with negative infectious workup for strep, influenza and covid. He was treated with augmentin with resolution of his sore throat and other symptoms. The symptoms returned in April with high fevers. He saw his PCP on 08/02/2021 noting redness of his eye, headache, fever, chills, fatigue and diffuse body aches.  No significant abnormalities were noted on exam.  His CBC and CMP were both normal.  His sed rate was normal at 12.  ANA and rheumatoid factor are both negative.   He was seen again in the ED on 08/13/2021.  Normal head CT scan.  Chest x-ray revealed only nonspecific interstitial thickening.  CBC was normal except for mildly elevated white blood cell count of 10,900 with a predominance of neutrophils.  His CMP showed slight elevation of his total protein and his ALT was up slightly at 53.  His sed rate was up to 55.  Blood cultures were negative.  HIV, Lyme and [facility] spotted fever antibodies were negative.  PCR testing for Plasmodium was also negative.   He was seen by infectious disease on 08/27/21 and 09/09/21 sed rate was 31 and CK was normal. LDH was low. CT Chest scan 10/05/21 was done at [facility] which showed diffuse bilateral bronchocentric,  perifissural and peripheral micronodularity and tree-in-bud opacities.  Mild central bronchiectasis.  Patchy consolidation at the right lower lobe.  Prominent mediastinal lymph nodes.  These findings were concerning for sarcoidosis prompting referral to pulmonary clinic.   Patient reports 20 pound weight loss since March due to lack of appetite.  He complains of a dry cough with occasional clear phlegm production.  He denies any nighttime awakenings due to the cough.  He denies any wheezing.  He reports diffuse joint aches that come and go.  He denies any vision changes but reports he has some redness of his eyes in the morning time.  He denies any skin rashes or changes.   He works as a Financial planner.  He has smoked cigarettes for the past 15 years, smoking half a pack per day.  He quit smoking for 7 to 8 months recently where he vaped during that time.  He is currently smoking cigarettes.  He denies any family history of lung disease or sarcoidosis.  No autoimmune conditions in the family.  01/19/2022: here today for planned outpatient bronchoscopy. He feels better. Less sob and cough. CT images reviewed. Persistent BL infiltrates. Planned for bal and TBBX today         Past Medical History:  Diagnosis Date   History of hemorrhoids  history of since child   Other specified disorders of liver     Tonsillitis     Urinary dysfunction      per [facility]           Family History  Problem Relation Age of Onset   Hypertension Mother     Kidney disease Paternal Uncle        Social History         Socioeconomic History   Marital status: Married      Spouse name: Not on file   Number of children: 0   Years of education: Not on file   Highest education level: Not on file  Occupational History   Occupation: Health and safety inspector  Tobacco Use   Smoking status: Every Day      Packs/day: 0.50      Years: 10.00      Total pack years: 5.00      Types: Cigarettes   Smokeless tobacco:  Never   Tobacco comments:      Pt states he smokes 1/2 ppd   Vaping Use   Vaping Use: Some days  Substance and Sexual Activity   Alcohol use: No      Alcohol/week: 4.0 standard drinks of alcohol      Types: 4 Cans of beer per week      Comment: rarely    Drug use: No   Sexual activity: Not Currently      Partners: Female  Other Topics Concern   Not on file  Social History Narrative    Single, no children    Financial planner [employer]    uses alcohol no drug use    Updated 08/06/2013              Social Determinants of Health    Financial Resource Strain: Not on file  Food Insecurity: Not on file  Transportation Needs: Not on file  Physical Activity: Not on file  Stress: Not on file  Social Connections: Not on file  Intimate Partner Violence: Not on file           Allergies  Allergen Reactions   Bactrim [Sulfamethoxazole-Trimethoprim] Other (See Comments) and Itching      Skin reactions.  Skin reactions.             Outpatient Medications Prior to Visit  Medication Sig Dispense Refill   Acetaminophen (TYLENOL PO) Take by mouth.       Vitamin D, Ergocalciferol, (DRISDOL) 1.25 MG (50000 UNIT) CAPS capsule Take 1 capsule (50,000 Units total) by mouth every 7 (seven) days. 12 capsule 1    No facility-administered medications prior to visit.      Review of Systems  Constitutional:  Negative for chills, fever, malaise/fatigue and weight loss.  HENT:  Negative for hearing loss, sore throat and tinnitus.   Eyes:  Negative for blurred vision and double vision.  Respiratory:  Positive for cough. Negative for hemoptysis, sputum production, shortness of breath, wheezing and stridor.   Cardiovascular:  Negative for chest pain, palpitations, orthopnea, leg swelling and PND.  Gastrointestinal:  Negative for abdominal pain, constipation, diarrhea, heartburn, nausea and vomiting.  Genitourinary:  Negative for dysuria, hematuria and urgency.  Musculoskeletal:  Negative  for joint pain and myalgias.  Skin:  Negative for itching and rash.  Neurological:  Negative for dizziness, tingling, weakness and headaches.  Endo/Heme/Allergies:  Negative for environmental allergies. Does not bruise/bleed easily.  Psychiatric/Behavioral:  Negative for depression. The patient is not  nervous/anxious and does not have insomnia.   All other systems reviewed and are negative.     Objective:          BP 112/71   Pulse 63   Temp 98.1 F (36.7 C) (Oral)   Resp 18   Ht _0  (1.626 m)   Wt 57.2 kg   SpO2 100%   BMI 21.63 kg/m     General appearance: 36 y.o., male, NAD, conversant  Eyes: anicteric sclerae, moist conjunctivae; no lid-lag; PERRLA, tracking appropriately HENT: NCAT; oropharynx, MMM, no mucosal ulcerations; normal hard and soft palate Neck: Trachea midline; FROM, supple, lymphadenopathy, no JVD Lungs: CTAB, no crackles, no wheeze, with normal respiratory effort and no intercostal retractions CV: RRR, S1, S2, no MRGs  Abdomen: Soft, non-tender; non-distended, BS present  Extremities: No peripheral edema, radial and DP pulses present bilaterally  Skin: Normal temperature, turgor and texture; no rash Psych: Appropriate affect Neuro: Alert and oriented to person and place, no focal deficit      CBC Labs (Brief)          Component Value Date/Time    WBC 6.0 09/30/2021 0929    RBC 5.62 09/30/2021 0929    HGB 16.4 09/30/2021 0929    HCT 48.9 09/30/2021 0929    PLT 303 09/30/2021 0929    MCV 87.0 09/30/2021 0929    MCH 29.2 09/30/2021 0929    MCHC 33.5 09/30/2021 0929    RDW 12.3 09/30/2021 0929    LYMPHSABS 1.0 08/13/2021 1432    MONOABS 0.8 08/13/2021 1432    EOSABS 0.1 08/13/2021 1432    BASOSABS 0.1 08/13/2021 1432            Latest Ref Rng & Units 09/30/2021    9:29 AM 09/09/2021   11:57 AM 08/13/2021    2:32 PM  BMP  Glucose 65 - 99 mg/dL 106  108  99   BUN 7 - 25 mg/dL _1 Creatinine 0.60 - 1.26 mg/dL 0.95  1.06  0.99    BUN/Creat Ratio 6 - 22 (calc) SEE NOTE:  NOT APPLICABLE     Sodium 485 - 146 mmol/L 139  138  135   Potassium 3.5 - 5.3 mmol/L 4.4  4.0  4.0   Chloride 98 - 110 mmol/L 101  100  98   CO2 20 - 32 mmol/L _2 Calcium 8.6 - 10.3 mg/dL 10.3  9.8  9.6       Chest imaging: CT Chest 10/05/21 diffuse bilateral bronchocentric, perifissural and peripheral micronodularity and tree-in-bud opacities.  Mild central bronchiectasis.  Patchy consolidation at the right lower lobe.  Prominent mediastinal lymph nodes.    PFT:       No data to display             Labs:   Path:   Echo:   Heart Catheterization:   Assessment & Plan:    Pulmonary infiltrates - Plan: Ambulatory referral to Pulmonology   Unintentional weight loss   Mediastinal adenopathy - Plan: Ambulatory referral to Pulmonology   Discussion: Reis Goga is a 36 year old male, daily smoker who is referred to pulmonary clinic for abnormal chest imaging.    His chest imaging is concerning for sarcoidosis given the diffuse micronodularity of the lung parenchyma, airways and perifissural spaces along with mediastinal adenopathy. He also has clinical features of weight loss, fevers, and joint pains consistent with sarcoidosis.  Differential includes vaping related lung disease or respiratory bronchiolitis from cigarette use.    We will schedule follow up for 3 months but be in contact for bronchoscopy and thereafter with results.  Plan:  Here today for planned outpatient bronchoscopy as scheduled per Dr. Erin Fulling.  Discussed Risks benefits and alternatives with patient  He is agreeable to proceed with BAL and TBBx  We talked about risk of pneumothorax  Garner Nash, DO [facility] 01/19/2022 1:22 PM

## 2022-01-19 NOTE — Anesthesia Procedure Notes (Signed)
Procedure Name: Intubation Date/Time: 01/19/2022 2:08 PM  Performed by: Barrington Ellison, CRNAPre-anesthesia Checklist: Patient identified, Emergency Drugs available, Suction available and Patient being monitored Patient Re-evaluated:Patient Re-evaluated prior to induction Oxygen Delivery Method: Circle System Utilized Preoxygenation: Pre-oxygenation with 100% oxygen Induction Type: IV induction Ventilation: Mask ventilation without difficulty Laryngoscope Size: Mac and 4 Grade View: Grade I Tube type: Oral Tube size: 8.5 mm Number of attempts: 1 Airway Equipment and Method: Stylet and Oral airway Placement Confirmation: ETT inserted through vocal cords under direct vision, positive ETCO2 and breath sounds checked- equal and bilateral Secured at: 22 cm Tube secured with: Tape Dental Injury: Teeth and Oropharynx as per pre-operative assessment

## 2022-01-19 NOTE — Discharge Instructions (Signed)
Flexible Bronchoscopy, Care After This sheet gives you information about how to care for yourself after your test. Your doctor may also give you more specific instructions. If you have problems or questions, contact your doctor. Follow these instructions at home: Eating and drinking Do not eat or drink anything (not even water) for 2 hours after your test, or until your numbing medicine (local anesthetic) wears off. When your numbness is gone and your cough and gag reflexes have come back, you may: Eat only soft foods. Slowly drink liquids. The day after the test, go back to your normal diet. Driving Do not drive for 24 hours if you were given a medicine to help you relax (sedative). Do not drive or use heavy machinery while taking prescription pain medicine. General instructions  Take over-the-counter and prescription medicines only as told by your doctor. Return to your normal activities as told. Ask what activities are safe for you. Do not use any products that have nicotine or tobacco in them. This includes cigarettes and e-cigarettes. If you need help quitting, ask your doctor. Keep all follow-up visits as told by your doctor. This is important. It is very important if you had a tissue sample (biopsy) taken. Get help right away if: You have shortness of breath that gets worse. You get light-headed. You feel like you are going to pass out (faint). You have chest pain. You cough up: More than a little blood. More blood than before. Summary Do not eat or drink anything (not even water) for 2 hours after your test, or until your numbing medicine wears off. Do not use cigarettes. Do not use e-cigarettes. Get help right away if you have chest pain.  This information is not intended to replace advice given to you by your health care provider. Make sure you discuss any questions you have with your health care provider. Document Released: 12/12/2008 Document Revised: 01/27/2017 Document  Reviewed: 03/04/2016 Elsevier Patient Education  2020 [employer].

## 2022-01-19 NOTE — Op Note (Signed)
Video Bronchoscopy Procedure Note  Date of Operation: 01/19/2022  Pre-op Diagnosis: Bilateral pulmonary infiltrates  Post-op Diagnosis: Same  Surgeon: Garner Nash, DO   Assistants: none  Anesthesia: general, per anesthesia record   Operation: Flexible video fiberoptic bronchoscopy and biopsies.  Estimated Blood Loss: <1 cc  Complications: none noted  Indications and History: Tyler [name] is 36 y.o. with history of bilateral infiltrates.  Recommendation was to perform video fiberoptic bronchoscopy with biopsies. The risks, benefits, complications, treatment options and expected outcomes were discussed with the patient.  The possibilities of pneumothorax, pneumonia, reaction to medication, pulmonary aspiration, perforation of a viscus, bleeding, failure to diagnose a condition and creating a complication requiring transfusion or operation were discussed with the patient who freely signed the consent.    Description of Procedure: The patient was seen in the Preoperative Area, was examined and was deemed appropriate to proceed.  The patient was taken to [facility], identified as [name] and the procedure verified as Flexible Video Fiberoptic Bronchoscopy.  A Time Out was held and the above information confirmed.   Standard therapeutic bronchoscope was inserted into the endotracheal tube.  The bilateral airways were examined.  There was normal-appearing right and left lung anatomy.  A BAL was complete from the right upper lobe anterior segment with approximately 120 cc of saline and moderate return.  This to be sent for cultures and cytology.  Then under fluoroscopy using 2 mm [employer] forceps we completed transbronchial biopsies to the right upper lobe apical and anterior segments followed by transbronchial biopsies of the right lower lobe posterior and lateral segments.  Samples: 1.  Bronchial alveolar lavage right upper lobe 2.  Transbronchial biopsies of right  upper lobe 3.  Transbronchial biopsies right lower lobe.  Plans:  We will review the cytology, pathology and microbiology results with the patient when they become available.  Outpatient followup will be with Dr. Alexis Frock, DO [facility] 01/19/2022 2:44 PM       Baltazar Apo, MD, PhD 01/19/2022, 2:40 PM [facility] [phone] or if no answer [phone]

## 2022-01-20 NOTE — Anesthesia Postprocedure Evaluation (Signed)
Anesthesia Post Note  Patient: Tyler [name]  Procedure(s) Performed: VIDEO BRONCHOSCOPY WITH FLUORO BRONCHIAL WASHINGS BRONCHIAL BIOPSIES     Patient location during evaluation: PACU Anesthesia Type: General Level of consciousness: awake and alert Pain management: pain level controlled Vital Signs Assessment: post-procedure vital signs reviewed and stable Respiratory status: spontaneous breathing, nonlabored ventilation, respiratory function stable and patient connected to nasal cannula oxygen Cardiovascular status: blood pressure returned to baseline and stable Postop Assessment: no apparent nausea or vomiting Anesthetic complications: no   No notable events documented.  Last Vitals:  Vitals:   01/19/22 1520 01/19/22 1530  BP: 113/78 113/68  Pulse: 72 68  Resp: 13 16  Temp:    SpO2: 99% 98%    Last Pain:  Vitals:   01/19/22 1530  TempSrc:   PainSc: 0-No pain                 Child Campoy S

## 2022-01-21 LAB — CULTURE, BAL-QUANTITATIVE W GRAM STAIN: Culture: NO GROWTH

## 2022-01-23 ENCOUNTER — Encounter (HOSPITAL_COMMUNITY): Payer: Self-pay | Admitting: Pulmonary Disease

## 2022-01-24 LAB — ANAEROBIC CULTURE W GRAM STAIN

## 2022-01-24 LAB — SURGICAL PATHOLOGY

## 2022-01-25 LAB — CYTOLOGY - NON PAP

## 2022-01-25 NOTE — Progress Notes (Signed)
I called and spoke with patient regarding bronchoscopy results.  He has an appointment to see Dr. Erin Fulling on 02/02/2022.  Thanks,  BLI  Garner Nash, DO [facility] 01/25/2022 2:30 PM

## 2022-02-02 ENCOUNTER — Encounter: Payer: Self-pay | Admitting: Pulmonary Disease

## 2022-02-02 ENCOUNTER — Ambulatory Visit (INDEPENDENT_AMBULATORY_CARE_PROVIDER_SITE_OTHER): Payer: Managed Care, Other (non HMO) | Admitting: Pulmonary Disease

## 2022-02-02 VITALS — BP 106/68 | HR 87 | Ht 64.0 in | Wt 129.8 lb

## 2022-02-02 DIAGNOSIS — D869 Sarcoidosis, unspecified: Secondary | ICD-10-CM | POA: Diagnosis not present

## 2022-02-02 MED ORDER — PREDNISONE 10 MG PO TABS: 20.0000 mg | ORAL_TABLET | Freq: Every day | ORAL | 3 refills | Status: DC

## 2022-02-02 NOTE — Patient Instructions (Addendum)
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

## 2022-02-02 NOTE — Progress Notes (Signed)
Synopsis: Referred in September 2023 for abnormal chest imaging by Dr. Megan Salon  Subjective:   PATIENT ID: Tyler [name] GENDER: male DOB: 1985/12/23, MRN: [id number]  HPI  Chief Complaint  Patient presents with   Follow-up   Tyler [name] is a 36 year old male, daily smoker who returns to pulmonary clinic for abnormal chest imaging.   Patient had EBUS 11/17/21 with normal appearing lymph node tissue from station & biopsies. He developed pneumomediastinum post-procedure which self resolved. He underwent bronchoscopy with transbronchial biopsies which showed giant cells in RUL BAL and giant cells with occasional asteroid bodies on transbronchial biopsy tissue samples. These findings are consistent with the initial concern of sarcoidosis.  He is smoking a couple to few cigarettes per day. He is feeling better overall and has been gaining some weight but does have exertional dyspnea at times. He is hoping to start exercising again.   Initial OV 11/03/21 He went to [place] in March where he developed high fevers, sore throat and cough. He returned back to the [place] where he was seen in Urgent care with negative infectious workup for strep, influenza and covid. He was treated with augmentin with resolution of his sore throat and other symptoms. The symptoms returned in April with high fevers. He saw his PCP on 08/02/2021 noting redness of his eye, headache, fever, chills, fatigue and diffuse body aches.  No significant abnormalities were noted on exam.  His CBC and CMP were both normal.  His sed rate was normal at 12.  ANA and rheumatoid factor are both negative.  He was seen again in the ED on 08/13/2021.  Normal head CT scan.  Chest x-ray revealed only nonspecific interstitial thickening.  CBC was normal except for mildly elevated white blood cell count of 10,900 with a predominance of neutrophils.  His CMP showed slight elevation of his total protein and his ALT was up slightly at 53.  His sed rate  was up to 55.  Blood cultures were negative.  HIV, Lyme and [facility] spotted fever antibodies were negative.  PCR testing for Plasmodium was also negative.  He was seen by infectious disease on 08/27/21 and 09/09/21 sed rate was 31 and CK was normal. LDH was low. CT Chest scan 10/05/21 was done at [facility] which showed diffuse bilateral bronchocentric, perifissural and peripheral micronodularity and tree-in-bud opacities.  Mild central bronchiectasis.  Patchy consolidation at the right lower lobe.  Prominent mediastinal lymph nodes.  These findings were concerning for sarcoidosis prompting referral to pulmonary clinic.  Patient reports 20 pound weight loss since March due to lack of appetite.  He complains of a dry cough with occasional clear phlegm production.  He denies any nighttime awakenings due to the cough.  He denies any wheezing.  He reports diffuse joint aches that come and go.  He denies any vision changes but reports he has some redness of his eyes in the morning time.  He denies any skin rashes or changes.  He works as a Financial planner.  He has smoked cigarettes for the past 15 years, smoking half a pack per day.  He quit smoking for 7 to 8 months recently where he vaped during that time.  He is currently smoking cigarettes.  He denies any family history of lung disease or sarcoidosis.  No autoimmune conditions in the family.  Past Medical History:  Diagnosis Date   Anxiety    Depression    due to family reasons, under control now and  on no medications   Dyspnea    Ear pain    swimmers ear   GERD (gastroesophageal reflux disease)    History of hemorrhoids    history of since child   Other specified disorders of liver    Tonsillitis    Urinary dysfunction    [facility]     Family History  Problem Relation Age of Onset   Hypertension Mother    Kidney disease Paternal Uncle      Social History   Socioeconomic History   Marital status: Married    Spouse name:  Not on file   Number of children: 0   Years of education: Not on file   Highest education level: Not on file  Occupational History   Occupation: Health and safety inspector  Tobacco Use   Smoking status: Every Day    Packs/day: 0.50    Years: 10.00    Total pack years: 5.00    Types: Cigarettes   Smokeless tobacco: Never   Tobacco comments:    Pt states he smokes 1/2 ppd   Vaping Use   Vaping Use: Former   Quit date: 07/29/2021  Substance and Sexual Activity   Alcohol use: Yes    Alcohol/week: 6.0 - 8.0 standard drinks of alcohol    Types: 6 - 8 Cans of beer per week    Comment: weekends   Drug use: No   Sexual activity: Not Currently    Partners: Female  Other Topics Concern   Not on file  Social History Narrative   Single, no children   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     Lactose Intolerance (Gi) Diarrhea and Other (See Comments)    bloating     No outpatient medications prior to visit.   No facility-administered medications prior to visit.    Review of Systems  Constitutional:  Negative for chills, fever, malaise/fatigue and weight loss.  HENT:  Negative for congestion, sinus pain and sore throat.   Eyes: Negative.   Respiratory:  Positive for cough and shortness of breath. Negative for hemoptysis, sputum production and wheezing.   Cardiovascular:  Negative for chest pain, palpitations, orthopnea, claudication and leg swelling.  Gastrointestinal:  Negative for abdominal pain, heartburn, nausea and vomiting.   Genitourinary: Negative.   Musculoskeletal:  Negative for joint pain and myalgias.  Skin:  Negative for rash.  Neurological:  Negative for weakness.  Endo/Heme/Allergies: Negative.   Psychiatric/Behavioral: Negative.     Objective:   Vitals:   02/02/22 1051  BP: 106/68  Pulse: 87  SpO2: 100%  Weight: 129 lb 12.8 oz (58.9 kg)  Height: _0  (1.626 m)     Physical Exam Constitutional:      General: He is not in acute distress. HENT:     Head: Normocephalic and atraumatic.  Eyes:     Conjunctiva/sclera: Conjunctivae normal.  Cardiovascular:     Rate and Rhythm: Normal rate and regular rhythm.     Pulses: Normal  pulses.     Heart sounds: Normal heart sounds. No murmur heard. Pulmonary:     Effort: Pulmonary effort is normal.     Breath sounds: Normal breath sounds.  Musculoskeletal:     Right lower leg: No edema.     Left lower leg: No edema.  Skin:    General: Skin is warm and dry.  Neurological:     General: No focal deficit present.     Mental Status: He is alert.    CBC    Component Value Date/Time   WBC 5.8 01/19/2022 1202   RBC 5.18 01/19/2022 1202   HGB 15.6 01/19/2022 1202   HCT 46.4 01/19/2022 1202   PLT 256 01/19/2022 1202   MCV 89.6 01/19/2022 1202   MCH 30.1 01/19/2022 1202   MCHC 33.6 01/19/2022 1202   RDW 12.4 01/19/2022 1202   LYMPHSABS 0.9 11/19/2021 1352   MONOABS 0.3 11/19/2021 1352   EOSABS 0.1 11/19/2021 1352   BASOSABS 0.0 11/19/2021 1352       Latest Ref Rng & Units 11/19/2021    1:52 PM 09/30/2021    9:29 AM 09/09/2021   11:57 AM  BMP  Glucose 70 - 99 mg/dL 194  106  108   BUN 6 - 20 mg/dL _0 Creatinine 0.61 - 1.24 mg/dL 0.93  0.95  1.06   BUN/Creat Ratio 6 - 22 (calc)  SEE NOTE:  NOT APPLICABLE   Sodium 371 - 145 mmol/L 140  139  138   Potassium 3.5 - 5.1 mmol/L 3.4  4.4  4.0   Chloride 98 - 111 mmol/L 108  101  100   CO2 22 - 32 mmol/L _1 Calcium 8.9 - 10.3 mg/dL 9.1  10.3  9.8     Chest imaging: CT  Chest 10/05/21 diffuse bilateral bronchocentric, perifissural and peripheral micronodularity and tree-in-bud opacities.  Mild central bronchiectasis.  Patchy consolidation at the right lower lobe.  Prominent mediastinal lymph nodes.   PFT:     No data to display          Labs:  Path 01/17/22: FINAL MICROSCOPIC DIAGNOSIS:   A. LUNG, RIGHT UPPER LOBE, TRANSBRONCHIAL BIOPSY:  - Bronchial tissue with submucosal giant cells with prominent asteroid  bodies. See comment.   B. LUNG, RIGHT LOWER LOBE, TRANSBRONCHIAL BIOPSY:  - Bronchial tissue with submucosal giant cells with occasional asteroid  bodies.   COMMENT:   The differential diagnosis of the process in specimens A and B includes  infection, foreign bodies, and sarcoid, among other entities.  AFB,  PAS-F and GMS special stains are negative for organisms.  Clinical  correlation is recommended.   Echo:  Heart Catheterization:  Assessment & Plan:   Sarcoidosis - Plan: Pulmonary Function Test, predniSONE (DELTASONE) 10 MG tablet  Discussion: Tyler [name] is a 36 year old male, daily smoker who returns to pulmonary clinic for abnormal chest imaging.   He had negative EBUS with TBNA samples from lymph node station 7 on 11/17/21. He had repeat bronchoscopy 01/17/22 with transbronchial biopsies which showed giant cells and asteroid bodies consistent with sarcoidosis.   We discussed starting treatment with steroid taper. He would like to start on the lower end of dosing as he has been progressively feeling better since our initial meeting. We will start 14m of prednisone daily. We will schedule him for pulmonary function tests.   Recommend nicotine lozenges to help him quit smoking.  Follow up in 6 months.   Freda Jackson, MD [facility] Office: [phone]   Current Outpatient Medications:    predniSONE (DELTASONE) 10 MG tablet, Take 2 tablets (20 mg total) by mouth daily with breakfast.,  Disp: 60 tablet, Rfl: 3

## 2022-02-12 ENCOUNTER — Encounter: Payer: Self-pay | Admitting: Pulmonary Disease

## 2022-02-17 LAB — FUNGUS CULTURE WITH STAIN

## 2022-02-17 LAB — FUNGUS CULTURE RESULT

## 2022-02-17 LAB — FUNGAL ORGANISM REFLEX

## 2022-10-13 ENCOUNTER — Encounter (INDEPENDENT_AMBULATORY_CARE_PROVIDER_SITE_OTHER): Payer: Self-pay

## 2022-11-04 ENCOUNTER — Encounter: Payer: Managed Care, Other (non HMO) | Admitting: Family Medicine

## 2022-12-06 ENCOUNTER — Other Ambulatory Visit: Payer: Self-pay

## 2022-12-06 ENCOUNTER — Ambulatory Visit
Admission: EM | Admit: 2022-12-06 | Discharge: 2022-12-06 | Disposition: A | Discharge status: Home / Self Care | Payer: Managed Care, Other (non HMO) | Attending: Physician Assistant | Admitting: Physician Assistant

## 2022-12-06 ENCOUNTER — Encounter: Payer: Self-pay | Admitting: Emergency Medicine

## 2022-12-06 DIAGNOSIS — J069 Acute upper respiratory infection, unspecified: Secondary | ICD-10-CM | POA: Diagnosis not present

## 2022-12-06 DIAGNOSIS — Z1152 Encounter for screening for COVID-19: Secondary | ICD-10-CM | POA: Diagnosis not present

## 2022-12-06 NOTE — ED Triage Notes (Signed)
Pt here for cough and congestion with body aches and sore throat x 3 days

## 2022-12-06 NOTE — ED Provider Notes (Signed)
[facility]    CSN: [id number] Arrival date & time: 12/06/22  0808      History   Chief Complaint Chief Complaint  Patient presents with   Sore Throat   Cough    HPI Tyler [name] is a 37 y.o. male.   Patient here today for evaluation of cough, congestion and body aches that started 3 days. Patient also reports sore throat. He suspects fever as he has had chills. He has tried OTC meds without resolution. He denies any nausea, vomiting but has had some diarrhea.  The history is provided by the patient.    Past Medical History:  Diagnosis Date   Anxiety    Depression    due to family reasons, under control now and on no medications   Dyspnea    Ear pain    swimmers ear   GERD (gastroesophageal reflux disease)    History of hemorrhoids    history of since child   Other specified disorders of liver    Tonsillitis    Urinary dysfunction    [facility]    Patient Active Problem List   Diagnosis Date Noted   Pulmonary infiltrates 09/30/2021   Unintentional weight loss 09/09/2021   FUO (fever of unknown origin) 08/27/2021   Acute right-sided low back pain without sciatica 12/04/2020   New daily persistent headache 12/04/2020   Fertility testing 11/12/2020   Other fatigue 11/12/2020   Hyperglycemia 11/12/2020   Palpitations 02/01/2019   Acute swimmer's ear of left side 09/26/2016   Nasal turbinate hypertrophy 09/26/2016   Hair loss 01/13/2016   Urinary frequency 01/13/2016   Right-sided chest pain 08/27/2015   Preventative health care 06/21/2015   Blood in stool 06/21/2015   GERD (gastroesophageal reflux disease) 07/12/2013   Liver lesion, right lobe 12/11/2012   Elevated amylase 11/26/2012    Past Surgical History:  Procedure Laterality Date   BRONCHIAL BIOPSY  01/19/2022   Procedure: BRONCHIAL BIOPSIES;  Surgeon: Josephine Igo, DO;  Location: [facility] ENDOSCOPY;  Service: Pulmonary;;   BRONCHIAL NEEDLE ASPIRATION BIOPSY  11/17/2021    Procedure: BRONCHIAL NEEDLE ASPIRATION BIOPSIES;  Surgeon: Martina Sinner, MD;  Location: [facility] ENDOSCOPY;  Service: Pulmonary;;   BRONCHIAL WASHINGS  11/17/2021   Procedure: BRONCHIAL WASHINGS;  Surgeon: Martina Sinner, MD;  Location: [facility] ENDOSCOPY;  Service: Pulmonary;;   BRONCHIAL WASHINGS  01/19/2022   Procedure: BRONCHIAL WASHINGS;  Surgeon: Josephine Igo, DO;  Location: [facility] ENDOSCOPY;  Service: Pulmonary;;   UPPER GI ENDOSCOPY     VIDEO BRONCHOSCOPY N/A 01/19/2022   Procedure: VIDEO BRONCHOSCOPY WITH FLUORO;  Surgeon: Josephine Igo, DO;  Location: [facility] ENDOSCOPY;  Service: Pulmonary;  Laterality: N/A;   VIDEO BRONCHOSCOPY WITH ENDOBRONCHIAL ULTRASOUND N/A 11/17/2021   Procedure: VIDEO BRONCHOSCOPY WITH ENDOBRONCHIAL ULTRASOUND;  Surgeon: Martina Sinner, MD;  Location: [facility] ENDOSCOPY;  Service: Pulmonary;  Laterality: N/A;   WISDOM TOOTH EXTRACTION         Home Medications    Prior to Admission medications   Medication Sig Start Date End Date Taking? Authorizing Provider  fluticasone (FLONASE) 50 MCG/ACT nasal spray Place 1 spray into both nostrils daily. 06/24/21  Yes [provider]  levocetirizine (XYZAL) 5 MG tablet Take 5 mg by mouth every evening. 06/24/21  Yes [provider]  nystatin cream (MYCOSTATIN) Apply 1 Application topically 2 (two) times daily. 12/31/16  Yes [provider]  oxymetazoline (NASAL RELIEF) 0.05 % nasal spray Place 1 spray into both  nostrils 2 (two) times daily as needed for congestion. 09/26/16  Yes [provider]  Vitamin D, Ergocalciferol, (DRISDOL) 1.25 MG (50000 UNIT) CAPS capsule Take 50,000 Units by mouth every 7 (seven) days. 11/18/20  Yes [provider]  meloxicam (MOBIC) 15 MG tablet Take 15 mg by mouth daily. Patient not taking: Reported on 12/06/2022 12/04/20   [provider]  predniSONE (DELTASONE) 10 MG tablet Take 2 tablets (20 mg total) by mouth daily with breakfast. Patient not  taking: Reported on 12/06/2022 02/02/22   Martina Sinner, MD    Family History Family History  Problem Relation Age of Onset   Hypertension Mother    Kidney disease Paternal Uncle     Social History Social History   Tobacco Use   Smoking status: Every Day    Current packs/day: 0.50    Average packs/day: 0.5 packs/day for 10.0 years (5.0 ttl pk-yrs)    Types: Cigarettes   Smokeless tobacco: Never   Tobacco comments:    Pt states he smokes 1/2 ppd   Vaping Use   Vaping status: Former   Quit date: 07/29/2021  Substance Use Topics   Alcohol use: Yes    Alcohol/week: 6.0 - 8.0 standard drinks of alcohol    Types: 6 - 8 Cans of beer per week    Comment: weekends   Drug use: No     Allergies   Sulfamethoxazole-trimethoprim and Lactose intolerance (gi)   Review of Systems Review of Systems  Constitutional:  Positive for chills and fever (subjective).  HENT:  Positive for congestion and sore throat. Negative for ear pain.   Eyes:  Negative for discharge and redness.  Respiratory:  Positive for cough. Negative for shortness of breath.   Gastrointestinal:  Positive for diarrhea. Negative for abdominal pain, nausea and vomiting.  Musculoskeletal:  Positive for myalgias.     Physical Exam Triage Vital Signs ED Triage Vitals  Encounter Vitals Group     BP      Systolic BP Percentile      Diastolic BP Percentile      Pulse      Resp      Temp      Temp src      SpO2      Weight      Height      Head Circumference      Peak Flow      Pain Score      Pain Loc      Pain Education      Exclude from Growth Chart    No data found.  Updated Vital Signs BP 96/62 (BP Location: Left Arm)   Pulse 98   Temp 99 F (37.2 C) (Oral)   Resp 18   SpO2 97%     Physical Exam Vitals and nursing note reviewed.  Constitutional:      General: He is not in acute distress.    Appearance: He is well-developed. He is not ill-appearing.  HENT:     Head: Normocephalic and  atraumatic.     Nose: Congestion present.     Mouth/Throat:     Mouth: Mucous membranes are moist.     Pharynx: Posterior oropharyngeal erythema present. No oropharyngeal exudate.     Tonsils: 0 on the right. 0 on the left.  Eyes:     Conjunctiva/sclera: Conjunctivae normal.  Cardiovascular:     Rate and Rhythm: Normal rate and regular rhythm.     Heart sounds:  Normal heart sounds. No murmur heard. Pulmonary:     Effort: Pulmonary effort is normal. No respiratory distress.     Breath sounds: Normal breath sounds. No wheezing, rhonchi or rales.  Skin:    General: Skin is warm and dry.  Neurological:     Mental Status: He is alert.  Psychiatric:        Mood and Affect: Mood normal.        Behavior: Behavior normal.      UC Treatments / Results  Labs (all labs ordered are listed, but only abnormal results are displayed) Labs Reviewed  SARS CORONAVIRUS 2 (TAT 6-24 HRS)    EKG   Radiology No results found.  Procedures Procedures (including critical care time)  Medications Ordered in UC Medications - No data to display  Initial Impression / Assessment and Plan / UC Course  I have reviewed the triage vital signs and the nursing notes.  Pertinent labs & imaging results that were available during my care of the patient were reviewed by me and considered in my medical decision making (see chart for details).    Suspect viral etiology of symptoms. Will screen for covid. Encouraged symptomatic treatment, increased fluids and rest.   Final Clinical Impressions(s) / UC Diagnoses   Final diagnoses:  Acute upper respiratory infection   Discharge Instructions   None    ED Prescriptions   None    PDMP not reviewed this encounter.   Tomi Bamberger, PA-C 12/06/22 [phone]

## 2022-12-07 LAB — SARS CORONAVIRUS 2 (TAT 6-24 HRS): SARS Coronavirus 2: NEGATIVE

## 2024-02-26 ENCOUNTER — Ambulatory Visit: Admitting: Family Medicine

## 2024-02-26 VITALS — BP 98/70 | HR 83 | Temp 98.5°F | Resp 16 | Ht 64.0 in | Wt 139.6 lb

## 2024-02-26 DIAGNOSIS — Z0000 Encounter for general adult medical examination without abnormal findings: Secondary | ICD-10-CM | POA: Diagnosis not present

## 2024-02-26 DIAGNOSIS — Z23 Encounter for immunization: Secondary | ICD-10-CM

## 2024-02-26 DIAGNOSIS — L853 Xerosis cutis: Secondary | ICD-10-CM

## 2024-02-26 LAB — CBC WITH DIFFERENTIAL/PLATELET
Basophils Absolute: 0 K/uL (ref 0.0–0.1)
Basophils Relative: 0.5 % (ref 0.0–3.0)
Eosinophils Absolute: 0.2 K/uL (ref 0.0–0.7)
Eosinophils Relative: 2.7 % (ref 0.0–5.0)
HCT: 47.5 % (ref 39.0–52.0)
Hemoglobin: 16.3 g/dL (ref 13.0–17.0)
Lymphocytes Relative: 23 % (ref 12.0–46.0)
Lymphs Abs: 1.6 K/uL (ref 0.7–4.0)
MCHC: 34.4 g/dL (ref 30.0–36.0)
MCV: 90.3 fl (ref 78.0–100.0)
Monocytes Absolute: 0.4 K/uL (ref 0.1–1.0)
Monocytes Relative: 5.2 % (ref 3.0–12.0)
Neutro Abs: 4.7 K/uL (ref 1.4–7.7)
Neutrophils Relative %: 68.6 % (ref 43.0–77.0)
Platelets: 268 K/uL (ref 150.0–400.0)
RBC: 5.26 Mil/uL (ref 4.22–5.81)
RDW: 12.5 % (ref 11.5–15.5)
WBC: 6.8 K/uL (ref 4.0–10.5)

## 2024-02-26 LAB — COMPREHENSIVE METABOLIC PANEL WITH GFR
ALT: 29 U/L (ref 3–53)
AST: 20 U/L (ref 5–37)
Albumin: 4.7 g/dL (ref 3.5–5.2)
Alkaline Phosphatase: 63 U/L (ref 39–117)
BUN: 9 mg/dL (ref 6–23)
CO2: 28 meq/L (ref 19–32)
Calcium: 9.6 mg/dL (ref 8.4–10.5)
Chloride: 102 meq/L (ref 96–112)
Creatinine, Ser: 0.79 mg/dL (ref 0.40–1.50)
GFR: 112.76 mL/min
Glucose, Bld: 108 mg/dL — ABNORMAL HIGH (ref 70–99)
Potassium: 4.1 meq/L (ref 3.5–5.1)
Sodium: 139 meq/L (ref 135–145)
Total Bilirubin: 0.5 mg/dL (ref 0.2–1.2)
Total Protein: 7.6 g/dL (ref 6.0–8.3)

## 2024-02-26 LAB — LIPID PANEL
Cholesterol: 215 mg/dL — ABNORMAL HIGH (ref 28–200)
HDL: 56.7 mg/dL
LDL Cholesterol: 128 mg/dL — ABNORMAL HIGH (ref 10–99)
NonHDL: 158.5
Total CHOL/HDL Ratio: 4
Triglycerides: 151 mg/dL — ABNORMAL HIGH (ref 10.0–149.0)
VLDL: 30.2 mg/dL (ref 0.0–40.0)

## 2024-02-26 LAB — TSH: TSH: 1.44 u[IU]/mL (ref 0.35–5.50)

## 2024-02-26 MED ORDER — AMMONIUM LACTATE 12 % EX CREA: 1.0000 | TOPICAL_CREAM | CUTANEOUS | 0 refills | Status: AC | PRN

## 2024-02-26 NOTE — Assessment & Plan Note (Signed)
 Ghm utd Check labs See AV[facility] Health Maintenance  Topic Date Due   Hepatitis B Vaccines 19-59 Average Risk (1 of 3 - 19+ 3-dose series) Never done   HPV VACCINES (1 - 3-dose SCDM series) Never done   COVID-19 Vaccine (3 - 2025-26 season) 03/13/2024 (Originally 10/30/2023)   DTaP/Tdap/Td (3 - Td or Tdap) 08/20/2031   Influenza Vaccine  Completed   Hepatitis C Screening  Completed   HIV Screening  Completed   Meningococcal B Vaccine  Aged Out   Pneumococcal Vaccine  Discontinued

## 2024-02-26 NOTE — Progress Notes (Signed)
 "  Subjective:    Patient ID: Tyler [name], male    DOB: [birth year], 38 y.o.   MRN: [id number]  Chief Complaint  Patient presents with   Annual Exam    Pt states not fasting     HPI Patient is in today for cpe.  Discussed the use of AI scribe software for clinical note transcription with the patient, who gave verbal consent to proceed.  History of Present Illness Tyler [name] is a 38 year old male who presents for an annual physical exam.  He has a history of sarcoidosis diagnosed in 2023 after experiencing fever and weight loss. Extensive testing, including a lung biopsy, confirmed the diagnosis. He was initially prescribed prednisone  but discontinued it due to side effects such as feeling slowed down. He has not been on any medication for sarcoidosis since then and reports feeling better, although he experiences frequent colds, cough, and mucus production. He has used antibiotics twice this year following online consultations, which temporarily resolved his symptoms.  He mentions recent weight loss, noting a decrease from his usual weight range of 140-145 pounds to 135-140 pounds. He attributes this to a loss of appetite during his illness in 2023.  He experienced hemorrhoids two months ago, which he treated with Preparation H ointment. There is a family history of hemorrhoids, including his parents and uncles. He is currently using Vibrance for a recurrence of symptoms.  He describes changes in his eyesight, noting a difference as he approaches 38 years of age. He visited an eye doctor, who did not find any serious issues. He uses computer glasses due to prolonged computer use.  He reports dry skin and itchiness, which he attributes to increased sensitivity. He has switched to fragrance-free, sensitive skin soaps but has not seen a dermatologist. No changes in diet or lifestyle except for increased stress.  He mentions knee discomfort but does not engage in regular exercise,  citing time constraints due to single parenting. He occasionally walks with his daughter in a stroller.  He is not currently taking any prescribed medications but has recently started using over-the-counter Theraflu for sneezing and mucus. He is not using any allergy medications.    Past Medical History:  Diagnosis Date   Anxiety    Depression    due to family reasons, under control now and on no medications   Dyspnea    Ear pain    swimmers ear   GERD (gastroesophageal reflux disease)    History of hemorrhoids    history of since child   Other specified disorders of liver    Tonsillitis    Urinary dysfunction    per [facility]    Past Surgical History:  Procedure Laterality Date   BRONCHIAL BIOPSY  01/19/2022   Procedure: BRONCHIAL BIOPSIES;  Surgeon: Brenna Adine CROME, DO;  Location: [facility] ENDOSCOPY;  Service: Pulmonary;;   BRONCHIAL NEEDLE ASPIRATION BIOPSY  11/17/2021   Procedure: BRONCHIAL NEEDLE ASPIRATION BIOPSIES;  Surgeon: Kara Dorn NOVAK, MD;  Location: [facility] ENDOSCOPY;  Service: Pulmonary;;   BRONCHIAL WASHINGS  11/17/2021   Procedure: BRONCHIAL WASHINGS;  Surgeon: Kara Dorn NOVAK, MD;  Location: [facility] ENDOSCOPY;  Service: Pulmonary;;   BRONCHIAL WASHINGS  01/19/2022   Procedure: BRONCHIAL WASHINGS;  Surgeon: Brenna Adine CROME, DO;  Location: [facility] ENDOSCOPY;  Service: Pulmonary;;   UPPER GI ENDOSCOPY     VIDEO BRONCHOSCOPY N/A 01/19/2022   Procedure: VIDEO BRONCHOSCOPY WITH FLUORO;  Surgeon: Brenna Adine CROME, DO;  Location: [facility] ENDOSCOPY;  Service: Pulmonary;  Laterality: N/A;   VIDEO BRONCHOSCOPY WITH ENDOBRONCHIAL ULTRASOUND N/A 11/17/2021   Procedure: VIDEO BRONCHOSCOPY WITH ENDOBRONCHIAL ULTRASOUND;  Surgeon: Kara Dorn NOVAK, MD;  Location: [facility] ENDOSCOPY;  Service: Pulmonary;  Laterality: N/A;   WISDOM TOOTH EXTRACTION      Family History  Problem Relation Age of Onset   Hypertension Mother    Kidney disease Paternal Uncle     Social History    Socioeconomic History   Marital status: Divorced    Spouse name: Not on file   Number of children: 0   Years of education: Not on file   Highest education level: Master's degree (e.g., MA, MS, MEng, MEd, MSW, MBA)  Occupational History   Occupation: Merchandiser, Retail  Tobacco Use   Smoking status: Every Day    Current packs/day: 0.50    Average packs/day: 0.5 packs/day for 10.0 years (5.0 ttl pk-yrs)    Types: Cigarettes   Smokeless tobacco: Never   Tobacco comments:    Pt states he smokes 1/2 ppd   Vaping Use   Vaping status: Former   Quit date: 07/29/2021  Substance and Sexual Activity   Alcohol use: Yes    Alcohol/week: 6.0 - 8.0 standard drinks of alcohol    Types: 6 - 8 Cans of beer per week    Comment: weekends   Drug use: No   Sexual activity: Not Currently    Partners: Female  Other Topics Concern   Not on file  Social History Narrative   Single, no children   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[employer]   uses alcohol no drug use   Updated 08/06/2013         Social Drivers of Health   Tobacco Use: High Risk (02/26/2024)   Patient History    Smoking Tobacco Use: Every Day    Smokeless Tobacco Use: Never    Passive Exposure: Not on file  Financial Resource Strain: Low Risk (02/26/2024)   Overall Financial Resource Strain (CARDIA)    Difficulty of Paying Living Expenses: Not hard at all  Food Insecurity: No Food Insecurity (02/26/2024)   Epic    Worried About Radiation Protection Practitioner of Food in the Last Year: Never true    Ran Out of Food in the Last Year: Never true  Transportation Needs: No Transportation Needs (02/26/2024)   Epic    Lack of Transportation (Medical): No    Lack of Transportation (Non-Medical): No  Physical Activity: Insufficiently Active (02/26/2024)   Exercise Vital Sign    Days of Exercise per Week: 2 days    Minutes of Exercise per Session: 10 min  Stress: Stress Concern Present (02/26/2024)   [employer] of Occupational Health - Occupational Stress  Questionnaire    Feeling of Stress: To some extent  Social Connections: Moderately Isolated (02/26/2024)   Social Connection and Isolation Panel    Frequency of Communication with Friends and Family: More than three times a week    Frequency of Social Gatherings with Friends and Family: Once a week    Attends Religious Services: More than 4 times per year    Active Member of [employer] or Organizations: No    Attends Banker Meetings: Not on file    Marital Status: Divorced  Intimate Partner Violence: Not on file  Depression (PHQ2-9): Low Risk (02/26/2024)   Depression (PHQ2-9)    PHQ-2 Score: 0  Alcohol Screen: Low Risk (02/26/2024)   Alcohol Screen    Last Alcohol Screening Score (AUDIT): 3  Housing: Low Risk (02/26/2024)   Epic    Unable to Pay for Housing in the Last Year: No    Number of Times Moved in the Last Year: 1    Homeless in the Last Year: No  Utilities: Not on file  Health Literacy: Not on file    Outpatient Medications Prior to Visit  Medication Sig Dispense Refill   Vitamin D , Ergocalciferol , (DRISDOL ) 1.25 MG (50000 UNIT) CAPS capsule Take 50,000 Units by mouth every 7 (seven) days.     fluticasone  (FLONASE ) 50 MCG/ACT nasal spray Place 1 spray into both nostrils daily. (Patient not taking: Reported on 02/26/2024)     levocetirizine (XYZAL ) 5 MG tablet Take 5 mg by mouth every evening. (Patient not taking: Reported on 02/26/2024)     meloxicam  (MOBIC ) 15 MG tablet Take 15 mg by mouth daily. (Patient not taking: Reported on 02/26/2024)     nystatin cream (MYCOSTATIN) Apply 1 Application topically 2 (two) times daily. (Patient not taking: Reported on 02/26/2024)     oxymetazoline (NASAL RELIEF) 0.05 % nasal spray Place 1 spray into both nostrils 2 (two) times daily as needed for congestion. (Patient not taking: Reported on 02/26/2024)     predniSONE  (DELTASONE ) 10 MG tablet Take 2 tablets (20 mg total) by mouth daily with breakfast. (Patient not taking:  Reported on 02/26/2024) 60 tablet 3   No facility-administered medications prior to visit.    Allergies[1]  Review of Systems  Constitutional:  Negative for fever and malaise/fatigue.  HENT:  Negative for congestion.   Eyes:  Negative for blurred vision.  Respiratory:  Negative for shortness of breath.   Cardiovascular:  Negative for chest pain, palpitations and leg swelling.  Gastrointestinal:  Negative for abdominal pain, blood in stool and nausea.  Genitourinary:  Negative for dysuria and frequency.  Musculoskeletal:  Negative for falls.  Skin:  Negative for rash.  Neurological:  Negative for dizziness, loss of consciousness and headaches.  Endo/Heme/Allergies:  Negative for environmental allergies.  Psychiatric/Behavioral:  Negative for depression. The patient is not nervous/anxious.        Objective:    Physical Exam Vitals and nursing note reviewed.  Constitutional:      General: He is not in acute distress.    Appearance: Normal appearance. He is well-developed.  HENT:     Head: Normocephalic and atraumatic.     Right Ear: Tympanic membrane, ear canal and external ear normal. There is no impacted cerumen.     Left Ear: Tympanic membrane, ear canal and external ear normal. There is no impacted cerumen.     Nose: Nose normal.     Mouth/Throat:     Mouth: Mucous membranes are moist.     Pharynx: Oropharynx is clear. No oropharyngeal exudate or posterior oropharyngeal erythema.  Eyes:     General: No scleral icterus.       Right eye: No discharge.        Left eye: No discharge.     Conjunctiva/sclera: Conjunctivae normal.     Pupils: Pupils are equal, round, and reactive to light.  Neck:     Thyroid : No thyromegaly.     Vascular: No JVD.  Cardiovascular:     Rate and Rhythm: Normal rate and regular rhythm.     Heart sounds: Normal heart sounds. No murmur heard. Pulmonary:     Effort: Pulmonary effort is normal. No respiratory distress.     Breath sounds: Normal  breath sounds.  Abdominal:  General: Bowel sounds are normal. There is no distension.     Palpations: Abdomen is soft. There is no mass.     Tenderness: There is no abdominal tenderness. There is no guarding or rebound.  Musculoskeletal:        General: Normal range of motion.     Cervical back: Normal range of motion and neck supple.     Right lower leg: No edema.     Left lower leg: No edema.  Lymphadenopathy:     Cervical: No cervical adenopathy.  Skin:    General: Skin is warm and dry.     Findings: No erythema or rash.  Neurological:     Mental Status: He is alert and oriented to person, place, and time.     Cranial Nerves: No cranial nerve deficit.     Motor: No abnormal muscle tone.     Deep Tendon Reflexes: Reflexes are normal and symmetric. Reflexes normal.  Psychiatric:        Mood and Affect: Mood normal.        Behavior: Behavior normal.        Thought Content: Thought content normal.        Judgment: Judgment normal.     BP 98/70 (BP Location: Left Arm, Patient Position: Sitting, Cuff Size: Normal)   Pulse 83   Temp 98.5 F (36.9 C) (Oral)   Resp 16   Ht 5' 4 (1.626 m)   Wt 139 lb 9.6 oz (63.3 kg)   SpO2 98%   BMI 23.96 kg/m  Wt Readings from Last 3 Encounters:  02/26/24 139 lb 9.6 oz (63.3 kg)  02/02/22 129 lb 12.8 oz (58.9 kg)  01/19/22 126 lb (57.2 kg)    Diabetic Foot Exam - Simple   No data filed    Lab Results  Component Value Date   WBC 5.8 01/19/2022   HGB 15.6 01/19/2022   HCT 46.4 01/19/2022   PLT 256 01/19/2022   GLUCOSE 194 (H) 11/19/2021   CHOL 248 (H) 11/12/2020   TRIG 204.0 (H) 11/12/2020   HDL 54.50 11/12/2020   LDLDIRECT 177.0 11/12/2020   LDLCALC 125 (H) 02/01/2019   ALT 17 09/30/2021   AST 15 09/30/2021   NA 140 11/19/2021   K 3.4 (L) 11/19/2021   CL 108 11/19/2021   CREATININE 0.93 11/19/2021   BUN 11 11/19/2021   CO2 24 11/19/2021   TSH 2.14 08/02/2021   PSA 1.38 02/11/2015   INR 1.1 08/13/2021   HGBA1C 5.9  11/12/2020    Lab Results  Component Value Date   TSH 2.14 08/02/2021   Lab Results  Component Value Date   WBC 5.8 01/19/2022   HGB 15.6 01/19/2022   HCT 46.4 01/19/2022   MCV 89.6 01/19/2022   PLT 256 01/19/2022   Lab Results  Component Value Date   NA 140 11/19/2021   K 3.4 (L) 11/19/2021   CO2 24 11/19/2021   GLUCOSE 194 (H) 11/19/2021   BUN 11 11/19/2021   CREATININE 0.93 11/19/2021   BILITOT 0.4 09/30/2021   ALKPHOS 85 08/13/2021   AST 15 09/30/2021   ALT 17 09/30/2021   PROT 7.4 09/30/2021   ALBUMIN 3.9 08/13/2021   CALCIUM 9.1 11/19/2021   ANIONGAP 8 11/19/2021   GFR 103.44 08/02/2021   Lab Results  Component Value Date   CHOL 248 (H) 11/12/2020   Lab Results  Component Value Date   HDL 54.50 11/12/2020   Lab Results  Component Value Date  LDLCALC 125 (H) 02/01/2019   Lab Results  Component Value Date   TRIG 204.0 (H) 11/12/2020   Lab Results  Component Value Date   CHOLHDL 5 11/12/2020   Lab Results  Component Value Date   HGBA1C 5.9 11/12/2020       Assessment & Plan:  Preventative health care Assessment & Plan: Ghm utd Check labs See AVS Health Maintenance  Topic Date Due   Hepatitis B Vaccines 19-59 Average Risk (1 of 3 - 19+ 3-dose series) Never done   HPV VACCINES (1 - 3-dose SCDM series) Never done   COVID-19 Vaccine (3 - 2025-26 season) 03/13/2024 (Originally 10/30/2023)   DTaP/Tdap/Td (3 - Td or Tdap) 08/20/2031   Influenza Vaccine  Completed   Hepatitis C Screening  Completed   HIV Screening  Completed   Meningococcal B Vaccine  Aged Out   Pneumococcal Vaccine  Discontinued     Orders: -     CBC with Differential/Platelet -     Comprehensive metabolic panel with GFR -     Lipid panel -     TSH  Need for influenza vaccination -     Flu vaccine trivalent PF, 6mos and older(Flulaval,Afluria,Fluarix,Fluzone)  Dry skin -     Ammonium Lactate; Apply 1 Application topically as needed for dry skin.  Dispense: 385 g;  Refill: 0  Assessment and Plan Assessment & Plan Xerosis cutis   He reports dry, sensitive skin with itchiness but no patches or severe symptoms. Stress and environmental factors may exacerbate the condition. No dermatological evaluation has been conducted. Prescribed a moisturizing cream and recommended over-the-counter antihistamines like Zyrtec or Claritin for itchiness. Advised regular use of moisturizer to prevent skin dryness.  General Health Maintenance   Discussed flu vaccination and COVID-19 booster. He has not received a flu shot this year and declined the COVID-19 booster. Emphasized the importance of flu vaccination, especially during the current flu season. Administered the flu shot and discussed the availability of annual COVID-19 boosters at pharmacies.    Harith Mccadden R Lowne Chase, DO    [1]  Allergies Allergen Reactions   Sulfamethoxazole -Trimethoprim  Itching, Other (See Comments) and Rash    Skin reactions.  Other Reaction(s): Other (See Comments)  Skin reactions. , Skin reactions. , Skin reactions.   Lactose Intolerance (Gi) Diarrhea and Other (See Comments)    bloating   "

## 2024-02-29 ENCOUNTER — Ambulatory Visit: Payer: Self-pay | Admitting: Family Medicine

## 2024-02-29 DIAGNOSIS — E785 Hyperlipidemia, unspecified: Secondary | ICD-10-CM

## 2024-03-01 ENCOUNTER — Encounter: Admitting: Family Medicine
